# Patient Record
Sex: Female | Born: 1990 | Race: White | Hispanic: No | Marital: Married | Smoking: Never smoker
Health system: Southern US, Community
[De-identification: ages and names within clinical notes are randomized; demographics above are authoritative.]

## PROBLEM LIST (undated history)

## (undated) DIAGNOSIS — J45909 Unspecified asthma, uncomplicated: Secondary | ICD-10-CM

## (undated) DIAGNOSIS — N83209 Unspecified ovarian cyst, unspecified side: Secondary | ICD-10-CM

## (undated) HISTORY — DX: Unspecified ovarian cyst, unspecified side: N83.209

## (undated) HISTORY — DX: Unspecified asthma, uncomplicated: J45.909

## (undated) HISTORY — PX: WISDOM TOOTH EXTRACTION: SHX21

---
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
## 2011-01-05 ENCOUNTER — Ambulatory Visit: Payer: Self-pay

## 2011-01-05 IMAGING — CT CT CHEST W/ CM
1 series · 16 of 32 positions shown, 20 images · IV contrast (APPLIED)
Comparison: none

REASON FOR EXAM: cp
COMMENTS:

PROCEDURE:     CT  - CT CHEST (FOR PE) W  - [DATE]  [DATE]
RESULT:     Chest CT dated [DATE].
TECHNIQUE: Helical 3 mm sections were obtained from the thoracic inlet to
the lung bases status post intravenous administration of 75 mL [N3].

[Series 4: soft tissue · axial · 0.60mm/px · z∈[-270,-21]mm · 16 of 92 slices shown, 20 images]
[im 6/92  soft-tissue]
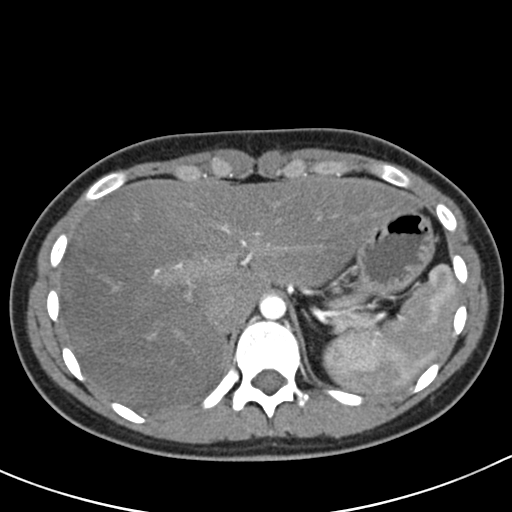
[im 6/92  bone]
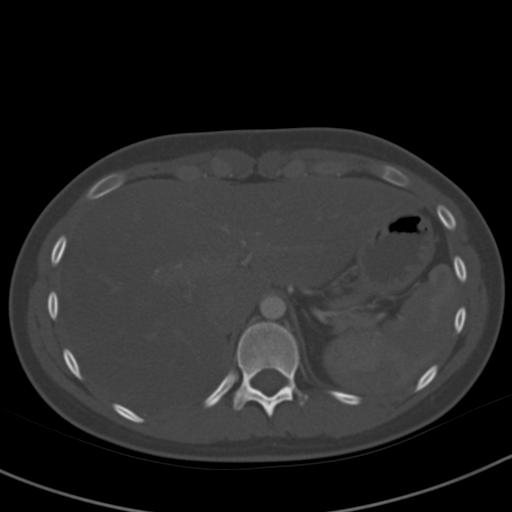
[im 12/92  soft-tissue]
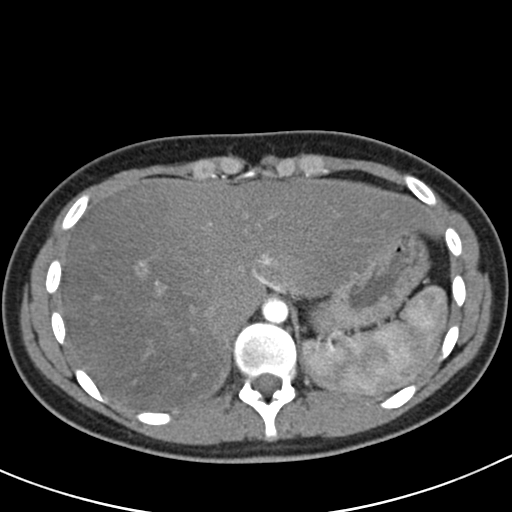
[im 18/92  soft-tissue]
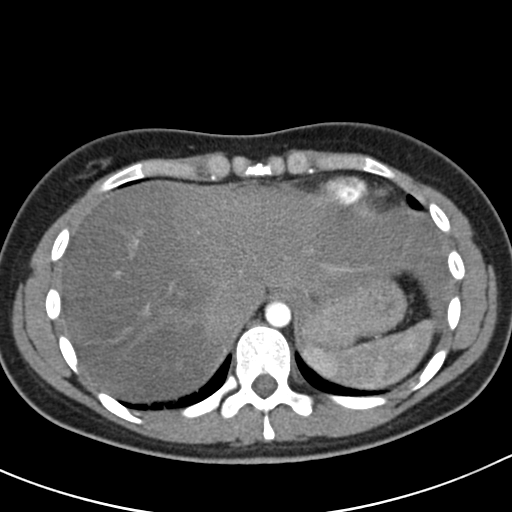
[im 24/92  soft-tissue]
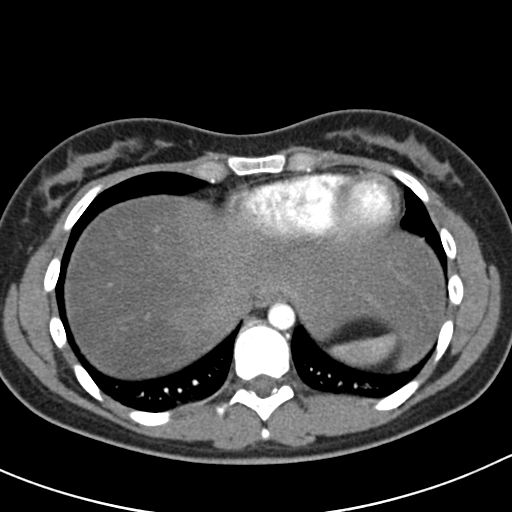
[im 30/92  soft-tissue]
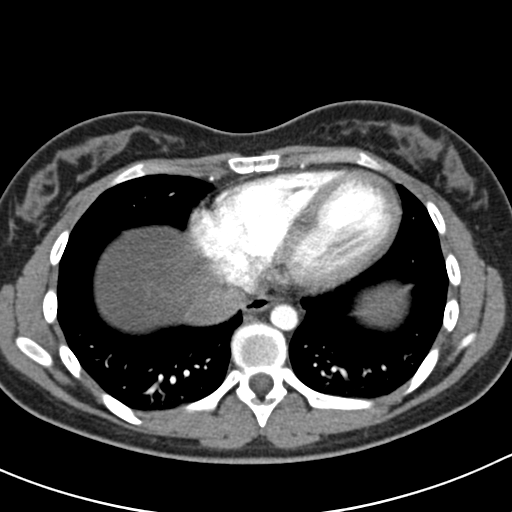
[im 36/92  soft-tissue]
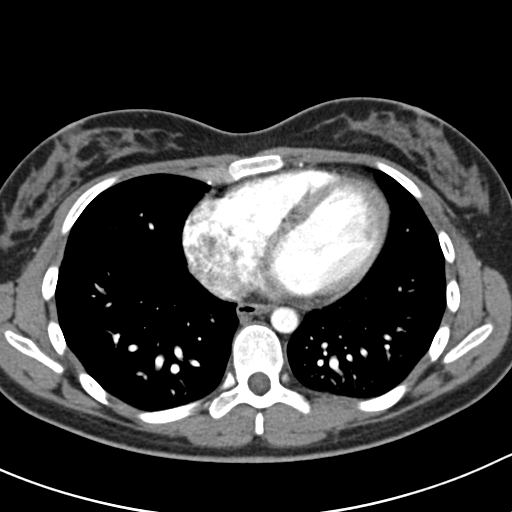
[im 42/92  soft-tissue]
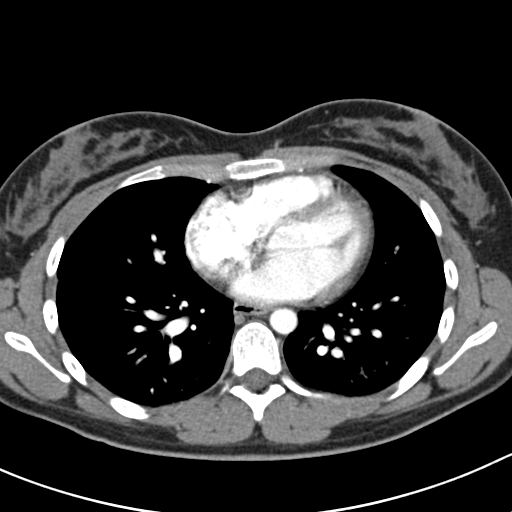
[im 50/92  soft-tissue]
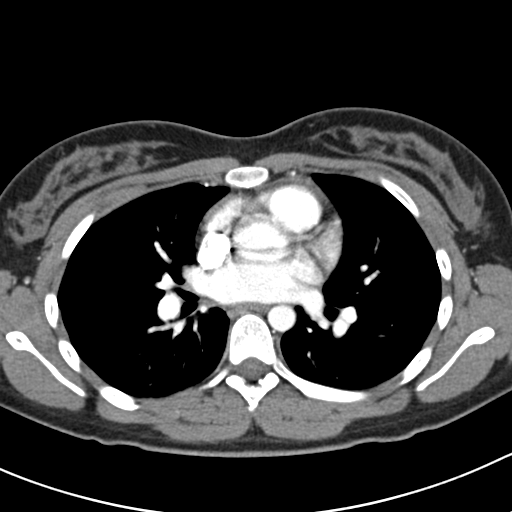
[im 56/92  soft-tissue]
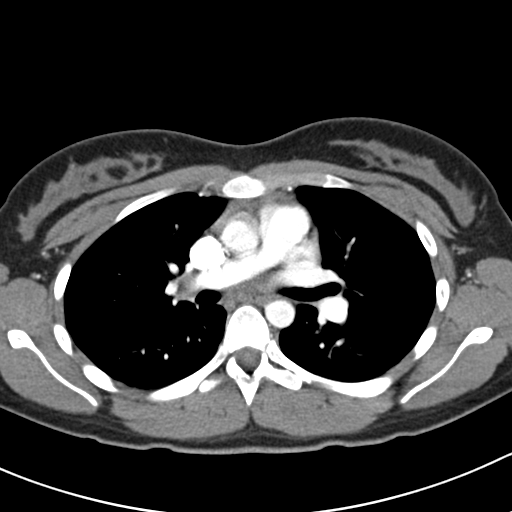
[im 56/92  bone]
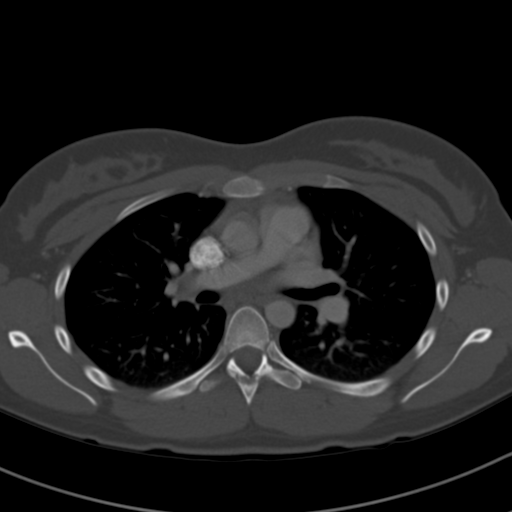
[im 62/92  soft-tissue]
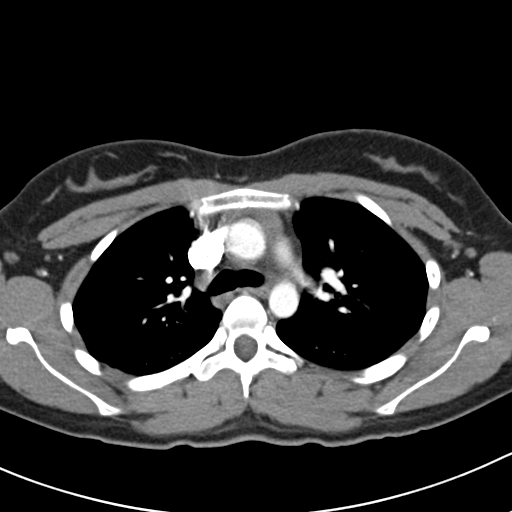
[im 68/92  soft-tissue]
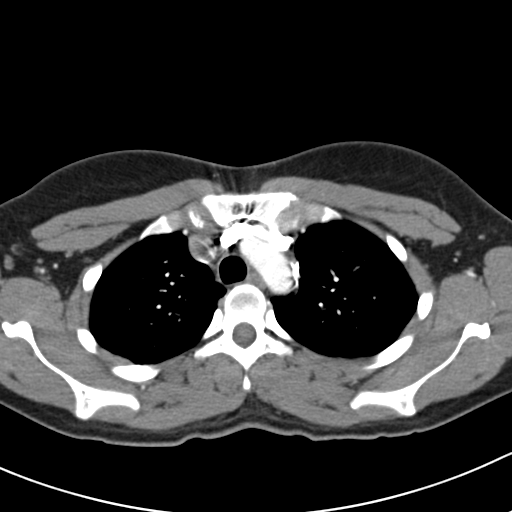
[im 74/92  soft-tissue]
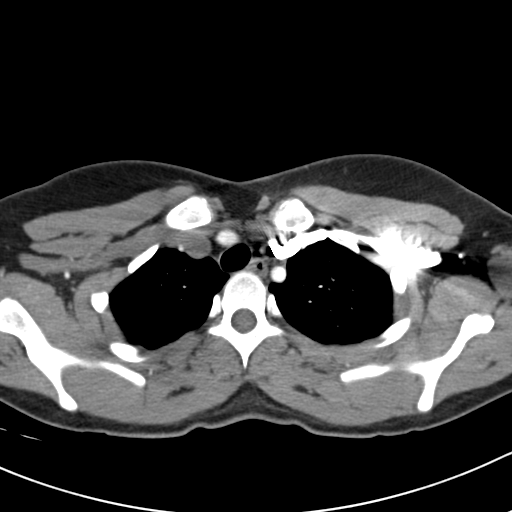
[im 80/92  soft-tissue]
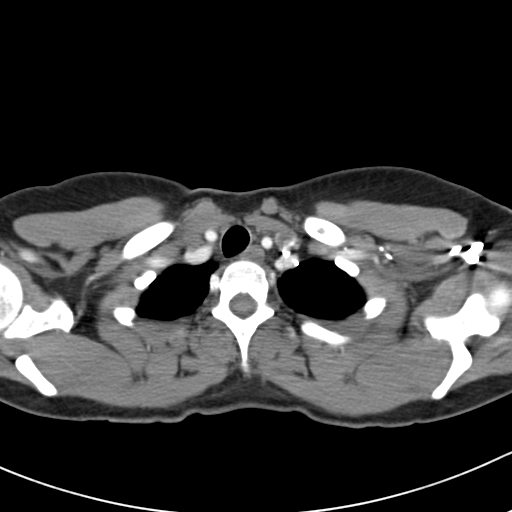
[im 80/92  lung]
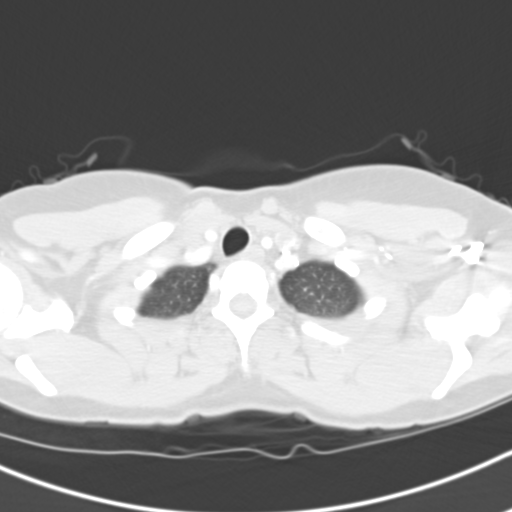
[im 83/92  lung]
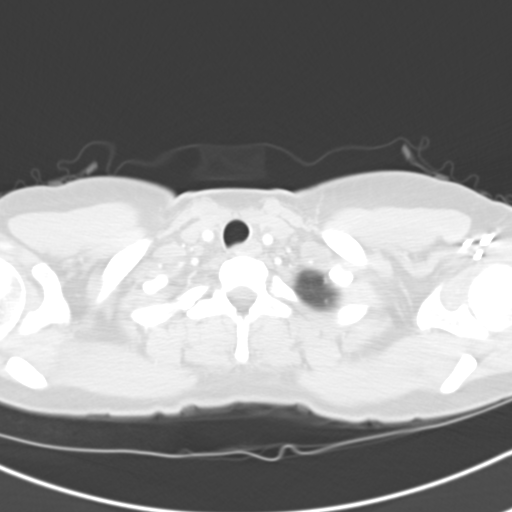
[im 86/92  soft-tissue]
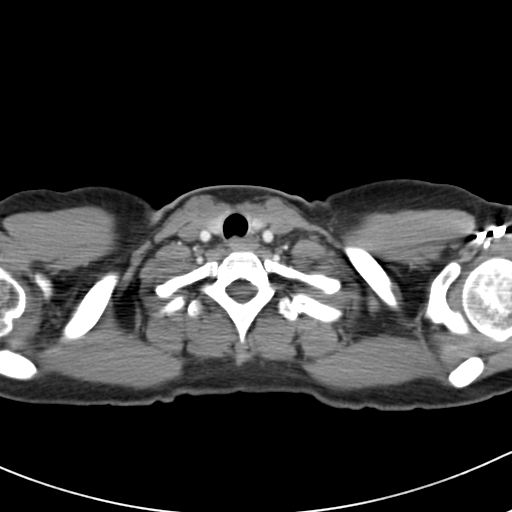
[im 86/92  lung]
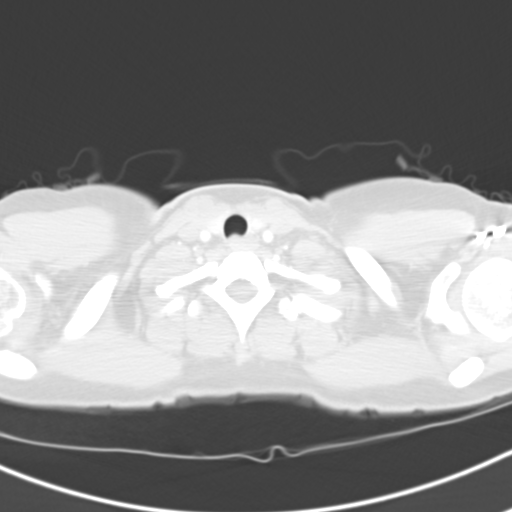
[im 89/92  lung]
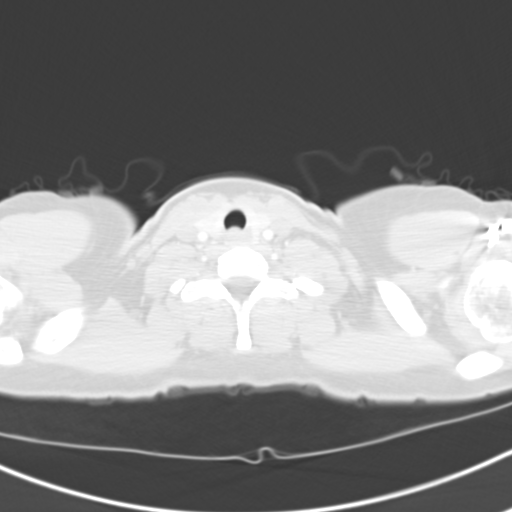

[16 of 32 positions shown; findings below may reference images not displayed]

FINDINGS: Evaluation of the mediastinum and hilar regions and structures
demonstrates no evidence of mediastinal nor hilar adenopathy nor masses.
There is no evidence of filling defects within the main, lobar, or segmental
pulmonary arteries. The lung parenchyma demonstrates mild hypoventilation
within the lung bases may no evidence of focal infiltrates, effusions,
edema, masses nor nodules. A diffuse area of low-attenuation projects along
the right lobe of the liver. Remaining visualized upper abdominal viscera
otherwise unremarkable.
IMPRESSION: No CT evidence of pulmonary arterial embolic disease.
2. Hepatic steatosis.

## 2013-01-08 ENCOUNTER — Emergency Department: Payer: Self-pay | Admitting: Emergency Medicine

## 2013-01-08 LAB — COMPREHENSIVE METABOLIC PANEL
Alkaline Phosphatase: 98 U/L (ref 50–136)
Anion Gap: 5 — ABNORMAL LOW (ref 7–16)
Calcium, Total: 9.2 mg/dL (ref 8.5–10.1)
Chloride: 105 mmol/L (ref 98–107)
Creatinine: 1.09 mg/dL (ref 0.60–1.30)
EGFR (African American): >60
EGFR (Non-African Amer.): >60
Glucose: 99 mg/dL (ref 65–99)
Osmolality: 276 (ref 275–301)
SGPT (ALT): 30 U/L (ref 12–78)
Sodium: 138 mmol/L (ref 136–145)
Total Protein: 7.9 g/dL (ref 6.4–8.2)

## 2013-01-08 LAB — CBC
HCT: 42.6 % (ref 35.0–47.0)
HGB: 14.8 g/dL (ref 12.0–16.0)
MCV: 89 fL (ref 80–100)
RBC: 4.77 10*6/uL (ref 3.80–5.20)
RDW: 11.9 % (ref 11.5–14.5)
WBC: 8.3 10*3/uL (ref 3.6–11.0)

## 2013-01-10 ENCOUNTER — Encounter: Payer: Self-pay | Admitting: Family Medicine

## 2013-01-17 ENCOUNTER — Ambulatory Visit (INDEPENDENT_AMBULATORY_CARE_PROVIDER_SITE_OTHER): Payer: Commercial Indemnity | Admitting: Family Medicine

## 2013-01-17 ENCOUNTER — Encounter: Payer: Self-pay | Admitting: Family Medicine

## 2013-01-17 VITALS — BP 122/82 | HR 92 | Ht 70.0 in | Wt 188.0 lb

## 2013-01-17 DIAGNOSIS — Z01419 Encounter for gynecological examination (general) (routine) without abnormal findings: Secondary | ICD-10-CM

## 2013-01-17 DIAGNOSIS — J45909 Unspecified asthma, uncomplicated: Secondary | ICD-10-CM

## 2013-01-17 DIAGNOSIS — N83209 Unspecified ovarian cyst, unspecified side: Secondary | ICD-10-CM | POA: Insufficient documentation

## 2013-01-17 DIAGNOSIS — Z124 Encounter for screening for malignant neoplasm of cervix: Secondary | ICD-10-CM

## 2013-01-17 HISTORY — DX: Unspecified asthma, uncomplicated: J45.909

## 2013-01-17 MED ORDER — LEVONORGEST-ETH ESTRAD 91-DAY 0.15-0.03 &0.01 MG PO TABS: 1.0000 | ORAL_TABLET | Freq: Every day | ORAL | Status: DC

## 2013-01-17 NOTE — Patient Instructions (Addendum)
Oral Contraception Information Oral contraceptives (OCs) are medicines taken to prevent pregnancy. OCs work by preventing the ovaries from releasing eggs. The hormones in OCs also cause the cervical mucus to thicken, preventing the sperm from entering the uterus. The hormones also cause the uterine lining to become thin, not allowing a fertilized egg to attach to the inside of the uterus. OCs are highly effective when taken exactly as prescribed. However, OCs do not prevent sexually transmitted diseases (STDs). Safe sex practices, such as using condoms along with the pill, can help prevent STDs.  Before taking the pill, you may have a physical exam and Pap test. Your caregiver may order blood tests that may be necessary. Your caregiver will make sure you are a good candidate for oral contraception. Discuss with your caregiver the possible side effects of the OC you may be prescribed. When starting an OC, it can take 2 to 3 months for the body to adjust to the changes in hormone levels in your body.  TYPES OF ORAL CONTRACEPTION  The combination pill. This pill contains estrogen and progestin (synthetic progesterone) hormones. The combination pill comes in either 21-day or 28-day packs. With 21-day packs, you do not take pills for 7 days after the last pill. With 28-day packs, the pill is taken every day. The last 7 pills are without hormones. Certain types of pills have more than 21 hormone-containing pills.  The minipill. This pill contains the progesterone hormone only. It is taken every day continuously. The minipill comes in packs of 91 pills. The first 84 pills contain the hormones, and the last 7 pills do not. The last 7 days are when you will have your menstrual period. You may experience irregular spotting. ADVANTAGES  Decreases premenstrual symptoms.  Treats menstrual period cramps.  Regulates the menstrual cycle.  Decreases a heavy menstrual flow.  Treats acne.  Treats abnormal uterine  bleeding.  Treats chronic pelvic pain.  Treats polycystic ovarian syndrome.  Treats endometriosis.  Can be used as emergency contraception. DISADVANTAGES OCs can be less effective if:  You forget to take the pill at the same time every day.  You have a stomach or intestinal disease that lessens the absorption of the pill.  You take OCs with other medicines that make OCs less effective.  You take expired OCs.  You forget to restart the pill on day 7, when using the packs of 21 pills. Document Released: 07/25/2002 Document Revised: 07/27/2011 Document Reviewed: 09/10/2010 [employer] Patient Information 2014 [employer]. Preventive Care for Adults, Female A healthy lifestyle and preventive care can promote health and wellness. Preventive health guidelines for women include the following key practices.  A routine yearly physical is a good way to check with your caregiver about your health and preventive screening. It is a chance to share any concerns and updates on your health, and to receive a thorough exam.  Visit your dentist for a routine exam and preventive care every 6 months. Brush your teeth twice a day and floss once a day. Good oral hygiene prevents tooth decay and gum disease.  The frequency of eye exams is based on your age, health, family medical history, use of contact lenses, and other factors. Follow your caregiver's recommendations for frequency of eye exams.  Eat a healthy diet. Foods like vegetables, fruits, whole grains, low-fat dairy products, and lean protein foods contain the nutrients you need without too many calories. Decrease your intake of foods high in solid fats, added sugars, and salt.   Eat the right amount of calories for you.Get information about a proper diet from your caregiver, if necessary.  Regular physical exercise is one of the most important things you can do for your health. Most adults should get at least 150 minutes of moderate-intensity  exercise (any activity that increases your heart rate and causes you to sweat) each week. In addition, most adults need muscle-strengthening exercises on 2 or more days a week.  Maintain a healthy weight. The body mass index (BMI) is a screening tool to identify possible weight problems. It provides an estimate of body fat based on height and weight. Your caregiver can help determine your BMI, and can help you achieve or maintain a healthy weight.For adults 20 years and older:  A BMI below 18.5 is considered underweight.  A BMI of 18.5 to 24.9 is normal.  A BMI of 25 to 29.9 is considered overweight.  A BMI of 30 and above is considered obese.  Maintain normal blood lipids and cholesterol levels by exercising and minimizing your intake of saturated fat. Eat a balanced diet with plenty of fruit and vegetables. Blood tests for lipids and cholesterol should begin at age 20 and be repeated every 5 years. If your lipid or cholesterol levels are high, you are over 50, or you are at high risk for heart disease, you may need your cholesterol levels checked more frequently.Ongoing high lipid and cholesterol levels should be treated with medicines if diet and exercise are not effective.  If you smoke, find out from your caregiver how to quit. If you do not use tobacco, do not start.  If you are pregnant, do not drink alcohol. If you are breastfeeding, be very cautious about drinking alcohol. If you are not pregnant and choose to drink alcohol, do not exceed 1 drink per day. One drink is considered to be 12 ounces (355 mL) of beer, 5 ounces (148 mL) of wine, or 1.5 ounces (44 mL) of liquor.  Avoid use of street drugs. Do not share needles with anyone. Ask for help if you need support or instructions about stopping the use of drugs.  High blood pressure causes heart disease and increases the risk of stroke. Your blood pressure should be checked at least every 1 to 2 years. Ongoing high blood pressure  should be treated with medicines if weight loss and exercise are not effective.  If you are 55 to 22 years old, ask your caregiver if you should take aspirin to prevent strokes.  Diabetes screening involves taking a blood sample to check your fasting blood sugar level. This should be done once every 3 years, after age 45, if you are within normal weight and without risk factors for diabetes. Testing should be considered at a younger age or be carried out more frequently if you are overweight and have at least 1 risk factor for diabetes.  Breast cancer screening is essential preventive care for women. You should practice "breast self-awareness." This means understanding the normal appearance and feel of your breasts and may include breast self-examination. Any changes detected, no matter how small, should be reported to a caregiver. Women in their 20s and 30s should have a clinical breast exam (CBE) by a caregiver as part of a regular health exam every 1 to 3 years. After age 40, women should have a CBE every year. Starting at age 40, women should consider having a mammography (breast X-ray test) every year. Women who have a family history of breast cancer   should talk to their caregiver about genetic screening. Women at a high risk of breast cancer should talk to their caregivers about having magnetic resonance imaging (MRI) and a mammography every year.  The Pap test is a screening test for cervical cancer. A Pap test can show cell changes on the cervix that might become cervical cancer if left untreated. A Pap test is a procedure in which cells are obtained and examined from the lower end of the uterus (cervix).  Women should have a Pap test starting at age 21.  Between ages 21 and 29, Pap tests should be repeated every 2 years.  Beginning at age 30, you should have a Pap test every 3 years as long as the past 3 Pap tests have been normal.  Some women have medical problems that increase the chance  of getting cervical cancer. Talk to your caregiver about these problems. It is especially important to talk to your caregiver if a new problem develops soon after your last Pap test. In these cases, your caregiver may recommend more frequent screening and Pap tests.  The above recommendations are the same for women who have or have not gotten the vaccine for human papillomavirus (HPV).  If you had a hysterectomy for a problem that was not cancer or a condition that could lead to cancer, then you no longer need Pap tests. Even if you no longer need a Pap test, a regular exam is a good idea to make sure no other problems are starting.  If you are between ages 65 and 70, and you have had normal Pap tests going back 10 years, you no longer need Pap tests. Even if you no longer need a Pap test, a regular exam is a good idea to make sure no other problems are starting.  If you have had past treatment for cervical cancer or a condition that could lead to cancer, you need Pap tests and screening for cancer for at least 20 years after your treatment.  If Pap tests have been discontinued, risk factors (such as a new sexual partner) need to be reassessed to determine if screening should be resumed.  The HPV test is an additional test that may be used for cervical cancer screening. The HPV test looks for the virus that can cause the cell changes on the cervix. The cells collected during the Pap test can be tested for HPV. The HPV test could be used to screen women aged 30 years and older, and should be used in women of any age who have unclear Pap test results. After the age of 30, women should have HPV testing at the same frequency as a Pap test.  Colorectal cancer can be detected and often prevented. Most routine colorectal cancer screening begins at the age of 50 and continues through age 75. However, your caregiver may recommend screening at an earlier age if you have risk factors for colon cancer. On a yearly  basis, your caregiver may provide home test kits to check for hidden blood in the stool. Use of a small camera at the end of a tube, to directly examine the colon (sigmoidoscopy or colonoscopy), can detect the earliest forms of colorectal cancer. Talk to your caregiver about this at age 50, when routine screening begins. Direct examination of the colon should be repeated every 5 to 10 years through age 75, unless early forms of pre-cancerous polyps or small growths are found.  Hepatitis C blood testing is recommended for all   people born from 1945 through 1965 and any individual with known risks for hepatitis C.  Practice safe sex. Use condoms and avoid high-risk sexual practices to reduce the spread of sexually transmitted infections (STIs). STIs include gonorrhea, chlamydia, syphilis, trichomonas, herpes, HPV, and human immunodeficiency virus (HIV). Herpes, HIV, and HPV are viral illnesses that have no cure. They can result in disability, cancer, and death. Sexually active women aged 25 and younger should be checked for chlamydia. Older women with new or multiple partners should also be tested for chlamydia. Testing for other STIs is recommended if you are sexually active and at increased risk.  Osteoporosis is a disease in which the bones lose minerals and strength with aging. This can result in serious bone fractures. The risk of osteoporosis can be identified using a bone density scan. Women ages 65 and over and women at risk for fractures or osteoporosis should discuss screening with their caregivers. Ask your caregiver whether you should take a calcium supplement or vitamin D to reduce the rate of osteoporosis.  Menopause can be associated with physical symptoms and risks. Hormone replacement therapy is available to decrease symptoms and risks. You should talk to your caregiver about whether hormone replacement therapy is right for you.  Use sunscreen with sun protection factor (SPF) of 30 or more.  Apply sunscreen liberally and repeatedly throughout the day. You should seek shade when your shadow is shorter than you. Protect yourself by wearing long sleeves, pants, a wide-brimmed hat, and sunglasses year round, whenever you are outdoors.  Once a month, do a whole body skin exam, using a mirror to look at the skin on your back. Notify your caregiver of new moles, moles that have irregular borders, moles that are larger than a pencil eraser, or moles that have changed in shape or color.  Stay current with required immunizations.  Influenza. You need a dose every fall (or winter). The composition of the flu vaccine changes each year, so being vaccinated once is not enough.  Pneumococcal polysaccharide. You need 1 to 2 doses if you smoke cigarettes or if you have certain chronic medical conditions. You need 1 dose at age 65 (or older) if you have never been vaccinated.  Tetanus, diphtheria, pertussis (Tdap, Td). Get 1 dose of Tdap vaccine if you are younger than age 65, are over 65 and have contact with an infant, are a healthcare worker, are pregnant, or simply want to be protected from whooping cough. After that, you need a Td booster dose every 10 years. Consult your caregiver if you have not had at least 3 tetanus and diphtheria-containing shots sometime in your life or have a deep or dirty wound.  HPV. You need this vaccine if you are a woman age 26 or younger. The vaccine is given in 3 doses over 6 months.  Measles, mumps, rubella (MMR). You need at least 1 dose of MMR if you were born in 1957 or later. You may also need a second dose.  Meningococcal. If you are age 19 to 21 and a first-year college student living in a residence hall, or have one of several medical conditions, you need to get vaccinated against meningococcal disease. You may also need additional booster doses.  Zoster (shingles). If you are age 60 or older, you should get this vaccine.  Varicella (chickenpox). If you have  never had chickenpox or you were vaccinated but received only 1 dose, talk to your caregiver to find out if you need this   vaccine.  Hepatitis A. You need this vaccine if you have a specific risk factor for hepatitis A virus infection or you simply wish to be protected from this disease. The vaccine is usually given as 2 doses, 6 to 18 months apart.  Hepatitis B. You need this vaccine if you have a specific risk factor for hepatitis B virus infection or you simply wish to be protected from this disease. The vaccine is given in 3 doses, usually over 6 months. Preventive Services / Frequency Ages 19 to 39  Blood pressure check.** / Every 1 to 2 years.  Lipid and cholesterol check.** / Every 5 years beginning at age 20.  Clinical breast exam.** / Every 3 years for women in their 20s and 30s.  Pap test.** / Every 2 years from ages 21 through 29. Every 3 years starting at age 30 through age 65 or 70 with a history of 3 consecutive normal Pap tests.  HPV screening.** / Every 3 years from ages 30 through ages 65 to 70 with a history of 3 consecutive normal Pap tests.  Hepatitis C blood test.** / For any individual with known risks for hepatitis C.  Skin self-exam. / Monthly.  Influenza immunization.** / Every year.  Pneumococcal polysaccharide immunization.** / 1 to 2 doses if you smoke cigarettes or if you have certain chronic medical conditions.  Tetanus, diphtheria, pertussis (Tdap, Td) immunization. / A one-time dose of Tdap vaccine. After that, you need a Td booster dose every 10 years.  HPV immunization. / 3 doses over 6 months, if you are 26 and younger.  Measles, mumps, rubella (MMR) immunization. / You need at least 1 dose of MMR if you were born in 1957 or later. You may also need a second dose.  Meningococcal immunization. / 1 dose if you are age 19 to 21 and a first-year college student living in a residence hall, or have one of several medical conditions, you need to get  vaccinated against meningococcal disease. You may also need additional booster doses.  Varicella immunization.** / Consult your caregiver.  Hepatitis A immunization.** / Consult your caregiver. 2 doses, 6 to 18 months apart.  Hepatitis B immunization.** / Consult your caregiver. 3 doses usually over 6 months. Ages 40 to 64  Blood pressure check.** / Every 1 to 2 years.  Lipid and cholesterol check.** / Every 5 years beginning at age 20.  Clinical breast exam.** / Every year after age 40.  Mammogram.** / Every year beginning at age 40 and continuing for as long as you are in good health. Consult with your caregiver.  Pap test.** / Every 3 years starting at age 30 through age 65 or 70 with a history of 3 consecutive normal Pap tests.  HPV screening.** / Every 3 years from ages 30 through ages 65 to 70 with a history of 3 consecutive normal Pap tests.  Fecal occult blood test (FOBT) of stool. / Every year beginning at age 50 and continuing until age 75. You may not need to do this test if you get a colonoscopy every 10 years.  Flexible sigmoidoscopy or colonoscopy.** / Every 5 years for a flexible sigmoidoscopy or every 10 years for a colonoscopy beginning at age 50 and continuing until age 75.  Hepatitis C blood test.** / For all people born from 1945 through 1965 and any individual with known risks for hepatitis C.  Skin self-exam. / Monthly.  Influenza immunization.** / Every year.  Pneumococcal polysaccharide immunization.** / 1   to 2 doses if you smoke cigarettes or if you have certain chronic medical conditions.  Tetanus, diphtheria, pertussis (Tdap, Td) immunization.** / A one-time dose of Tdap vaccine. After that, you need a Td booster dose every 10 years.  Measles, mumps, rubella (MMR) immunization. / You need at least 1 dose of MMR if you were born in 1957 or later. You may also need a second dose.  Varicella immunization.** / Consult your caregiver.  Meningococcal  immunization.** / Consult your caregiver.  Hepatitis A immunization.** / Consult your caregiver. 2 doses, 6 to 18 months apart.  Hepatitis B immunization.** / Consult your caregiver. 3 doses, usually over 6 months. Ages 65 and over  Blood pressure check.** / Every 1 to 2 years.  Lipid and cholesterol check.** / Every 5 years beginning at age 20.  Clinical breast exam.** / Every year after age 40.  Mammogram.** / Every year beginning at age 40 and continuing for as long as you are in good health. Consult with your caregiver.  Pap test.** / Every 3 years starting at age 30 through age 65 or 70 with a 3 consecutive normal Pap tests. Testing can be stopped between 65 and 70 with 3 consecutive normal Pap tests and no abnormal Pap or HPV tests in the past 10 years.  HPV screening.** / Every 3 years from ages 30 through ages 65 or 70 with a history of 3 consecutive normal Pap tests. Testing can be stopped between 65 and 70 with 3 consecutive normal Pap tests and no abnormal Pap or HPV tests in the past 10 years.  Fecal occult blood test (FOBT) of stool. / Every year beginning at age 50 and continuing until age 75. You may not need to do this test if you get a colonoscopy every 10 years.  Flexible sigmoidoscopy or colonoscopy.** / Every 5 years for a flexible sigmoidoscopy or every 10 years for a colonoscopy beginning at age 50 and continuing until age 75.  Hepatitis C blood test.** / For all people born from 1945 through 1965 and any individual with known risks for hepatitis C.  Osteoporosis screening.** / A one-time screening for women ages 65 and over and women at risk for fractures or osteoporosis.  Skin self-exam. / Monthly.  Influenza immunization.** / Every year.  Pneumococcal polysaccharide immunization.** / 1 dose at age 65 (or older) if you have never been vaccinated.  Tetanus, diphtheria, pertussis (Tdap, Td) immunization. / A one-time dose of Tdap vaccine if you are over 65 and  have contact with an infant, are a healthcare worker, or simply want to be protected from whooping cough. After that, you need a Td booster dose every 10 years.  Varicella immunization.** / Consult your caregiver.  Meningococcal immunization.** / Consult your caregiver.  Hepatitis A immunization.** / Consult your caregiver. 2 doses, 6 to 18 months apart.  Hepatitis B immunization.** / Check with your caregiver. 3 doses, usually over 6 months. ** Family history and personal history of risk and conditions may change your caregiver's recommendations. Document Released: 06/30/2001 Document Revised: 07/27/2011 Document Reviewed: 09/29/2010 [employer] Patient Information 2014 [employer].  

## 2013-01-17 NOTE — Progress Notes (Signed)
  Subjective:     Erin Whitney is a 22 y.o. female and is here for a comprehensive physical exam. The patient reports no problems. Seen in ED with Sciatica.  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  Now she is working first job as a Runner, broadcasting/film/video. Referred by Carolyne Fiscal. Not sexually active.  Had been on Seasonique previously for management of ovarian cysts.  Has not had Gardasil series.  History   Social History  . Marital Status: Single    Spouse Name: N/A    Number of Children: N/A  . Years of Education: N/A   Occupational History  . 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 Smoking status: Never Smoker   . Smokeless tobacco: Never Used  . Alcohol Use: Yes     Comment: social  . Drug Use: No  . Sexual Activity: No   Other Topics Concern  . Not on file   Social History Narrative  . No narrative on file   No health maintenance topics applied.  The following portions of the patient's history were reviewed and updated as appropriate: allergies, current medications, past family history, past medical history, past social history, past surgical history and problem list.  Review of Systems A comprehensive review of systems was negative.   Objective:    BP 122/82  Pulse 92  Ht 5\' 10"  (1.778 m)  Wt 188 lb (85.276 kg)  BMI 26.98 kg/m2  LMP 12/20/2012 General appearance: alert, cooperative and appears stated age Head: Normocephalic, without obvious abnormality, atraumatic Neck: no adenopathy, supple, symmetrical, trachea midline and thyroid not enlarged, symmetric, no tenderness/mass/nodules Lungs: clear to auscultation bilaterally Breasts: normal appearance, no masses or tenderness Heart: regular rate and rhythm, S1, S2 normal, no murmur, click, rub or gallop Abdomen: soft, non-tender; bowel sounds normal; no masses,  no organomegaly Pelvic: cervix normal in appearance, external genitalia normal, no adnexal masses or tenderness, no cervical motion tenderness, uterus  normal size, shape, and consistency and vagina normal without discharge Extremities: extremities normal, atraumatic, no cyanosis or edema Pulses: 2+ and symmetric Skin: Skin color, texture, turgor normal. No rashes or lesions Lymph nodes: Cervical, supraclavicular, and axillary nodes normal. Neurologic: Grossly normal    Assessment:    Healthy female exam.      Plan:    Pap smear today-- Recommend Gardasil series-she will discuss with her mom. Seasonique No need for STD testing. See After Visit Summary for Counseling Recommendations

## 2013-08-07 ENCOUNTER — Telehealth: Payer: Self-pay

## 2013-08-07 NOTE — Telephone Encounter (Signed)
Mother called regarding her daughters bill. Our billing department has billed her and is now sending her to collections. Originally it was covered 100% then they received another bill stating they had an outstanding balance. This was very confusing since her insurance covered her visit 100% they called to verify. Patient came in for a annual and it should have been covered. After a second call I called Marni to verify and she said there was an issue and that I need to contact our billing department about it. I called spoke to Darl Householder she check and said that there was a problem there was a refund of 202.65 to the patients insurance done by our outside billing company and that they didn't know why. She said they had to look into it and figure out why. At the time she couldn't give me any reason why. She said it would be put under review and assured me patient was not responsible at the time for the outstanding balance until they knew exactly what happened. I told them patient was being sent to collections and they were worried about patients credit. She said once they sent it over to another billing division to review she would no longer get sent to collections or be responsible for the bill at this time until further notice. Once it was fixed patient would receive an updated bill if she even had a balance. If not, she wouldn't get anything at all.

## 2014-06-26 ENCOUNTER — Ambulatory Visit: Payer: BC Managed Care – PPO | Admitting: Family Medicine

## 2014-07-11 ENCOUNTER — Ambulatory Visit (INDEPENDENT_AMBULATORY_CARE_PROVIDER_SITE_OTHER): Payer: BC Managed Care – PPO | Admitting: Physician Assistant

## 2014-07-11 ENCOUNTER — Encounter: Payer: Self-pay | Admitting: Physician Assistant

## 2014-07-11 VITALS — BP 128/79 | Ht 70.0 in | Wt 181.0 lb

## 2014-07-11 DIAGNOSIS — Z01419 Encounter for gynecological examination (general) (routine) without abnormal findings: Secondary | ICD-10-CM

## 2014-07-11 DIAGNOSIS — Z124 Encounter for screening for malignant neoplasm of cervix: Secondary | ICD-10-CM

## 2014-07-11 DIAGNOSIS — Z113 Encounter for screening for infections with a predominantly sexual mode of transmission: Secondary | ICD-10-CM | POA: Diagnosis not present

## 2014-07-11 DIAGNOSIS — Z1151 Encounter for screening for human papillomavirus (HPV): Secondary | ICD-10-CM | POA: Diagnosis not present

## 2014-07-11 DIAGNOSIS — Z3041 Encounter for surveillance of contraceptive pills: Secondary | ICD-10-CM | POA: Diagnosis not present

## 2014-07-11 MED ORDER — LEVONORGEST-ETH ESTRAD 91-DAY 0.15-0.03 &0.01 MG PO TABS: 1.0000 | ORAL_TABLET | Freq: Every day | ORAL | Status: DC

## 2014-07-11 NOTE — Patient Instructions (Signed)

## 2014-07-16 LAB — CYTOLOGY - PAP

## 2014-07-17 ENCOUNTER — Encounter: Payer: Self-pay | Admitting: Physician Assistant

## 2014-07-17 NOTE — Progress Notes (Signed)
Patient ID: CASSEY BACIGALUPO, female   DOB: February 09, 1991, 24 y.o.   MRN: [id number] History:  CHARLETT MERKLE is a 24 y.o. G0P0000 who presents to clinic today for annual exam.  She has no complaints.  Generic Seasonique worked well for her this past year.  She has now run out and is not using any hormonal therapies.  She is not presently sexually active.  She desires refill of OCP.  The following portions of the patient's history were reviewed and updated as appropriate: allergies, current medications, past family history, past medical history, past social history, past surgical history and problem list.  Review of Systems:  A comprehensive review of systems was negative.  Objective:  Physical Exam BP 128/79 mmHg  Ht 5\' 10"  (1.778 m)  Wt 181 lb (82.101 kg)  BMI 25.97 kg/m2  LMP 06/23/2014 GENERAL: Well-developed, well-nourished female in no acute distress.  HEENT: Normocephalic, atraumatic.  NECK: Supple. Normal thyroid.  LUNGS: Normal rate. Clear to auscultation bilaterally.  HEART: Regular rate and rhythm with no adventitious sounds.  BREASTS: Symmetric in size. No masses, skin changes, nipple drainage, or lymphadenopathy. ABDOMEN: Soft, nontender, nondistended. No organomegaly. Normal bowel sounds appreciated in all quadrants.  PELVIC: Normal external female genitalia. Vagina is pink and rugated.  Normal discharge. Normal cervix contour. Pap smear obtained. Uterus is normal in size. No adnexal mass or tenderness.  EXTREMITIES: No cyanosis, clubbing, or edema, 2+ distal pulses.   Labs and Imaging No results found.  Assessment & Plan:  Assessment: Healthy female desiring OCP  Plans: Seasonique x 1 year RTC PRN, 1 year for annual exam  Paticia Stack, PA-C 07/17/2014 8:14 AM

## 2017-07-13 ENCOUNTER — Encounter: Payer: Self-pay | Admitting: Family Medicine

## 2017-07-13 ENCOUNTER — Ambulatory Visit (INDEPENDENT_AMBULATORY_CARE_PROVIDER_SITE_OTHER): Payer: BC Managed Care – PPO | Admitting: Family Medicine

## 2017-07-13 VITALS — BP 132/82 | Wt 189.4 lb

## 2017-07-13 DIAGNOSIS — Z30011 Encounter for initial prescription of contraceptive pills: Secondary | ICD-10-CM

## 2017-07-13 DIAGNOSIS — Z124 Encounter for screening for malignant neoplasm of cervix: Secondary | ICD-10-CM

## 2017-07-13 DIAGNOSIS — Z01419 Encounter for gynecological examination (general) (routine) without abnormal findings: Secondary | ICD-10-CM

## 2017-07-13 MED ORDER — NORGESTIMATE-ETH ESTRADIOL 0.25-35 MG-MCG PO TABS: 1.0000 | ORAL_TABLET | Freq: Every day | ORAL | 4 refills | Status: DC

## 2017-07-13 NOTE — Patient Instructions (Signed)
Preventive Care 18-39 Years, Female Preventive care refers to lifestyle choices and visits with your health care provider that can promote health and wellness. What does preventive care include?  A yearly physical exam. This is also called an annual well check.  Dental exams once or twice a year.  Routine eye exams. Ask your health care provider how often you should have your eyes checked.  Personal lifestyle choices, including: ? Daily care of your teeth and gums. ? Regular physical activity. ? Eating a healthy diet. ? Avoiding tobacco and drug use. ? Limiting alcohol use. ? Practicing safe sex. ? Taking vitamin and mineral supplements as recommended by your health care provider. What happens during an annual well check? The services and screenings done by your health care provider during your annual well check will depend on your age, overall health, lifestyle risk factors, and family history of disease. Counseling Your health care provider may ask you questions about your:  Alcohol use.  Tobacco use.  Drug use.  Emotional well-being.  Home and relationship well-being.  Sexual activity.  Eating habits.  Work and work Statistician.  Method of birth control.  Menstrual cycle.  Pregnancy history.  Screening You may have the following tests or measurements:  Height, weight, and BMI.  Diabetes screening. This is done by checking your blood sugar (glucose) after you have not eaten for a while (fasting).  Blood pressure.  Lipid and cholesterol levels. These may be checked every 5 years starting at age 38.  Skin check.  Hepatitis C blood test.  Hepatitis B blood test.  Sexually transmitted disease (STD) testing.  BRCA-related cancer screening. This may be done if you have a family history of breast, ovarian, tubal, or peritoneal cancers.  Pelvic exam and Pap test. This may be done every 3 years starting at age 38. Starting at age 30, this may be done  every 5 years if you have a Pap test in combination with an HPV test.  Discuss your test results, treatment options, and if necessary, the need for more tests with your health care provider. Vaccines Your health care provider may recommend certain vaccines, such as:  Influenza vaccine. This is recommended every year.  Tetanus, diphtheria, and acellular pertussis (Tdap, Td) vaccine. You may need a Td booster every 10 years.  Varicella vaccine. You may need this if you have not been vaccinated.  HPV vaccine. If you are 39 or younger, you may need three doses over 6 months.  Measles, mumps, and rubella (MMR) vaccine. You may need at least one dose of MMR. You may also need a second dose.  Pneumococcal 13-valent conjugate (PCV13) vaccine. You may need this if you have certain conditions and were not previously vaccinated.  Pneumococcal polysaccharide (PPSV23) vaccine. You may need one or two doses if you smoke cigarettes or if you have certain conditions.  Meningococcal vaccine. One dose is recommended if you are age 68-21 years and a first-year college student living in a residence hall, or if you have one of several medical conditions. You may also need additional booster doses.  Hepatitis A vaccine. You may need this if you have certain conditions or if you travel or work in places where you may be exposed to hepatitis A.  Hepatitis B vaccine. You may need this if you have certain conditions or if you travel or work in places where you may be exposed to hepatitis B.  Haemophilus influenzae type b (Hib) vaccine. You may need this  if you have certain risk factors.  Talk to your health care provider about which screenings and vaccines you need and how often you need them. This information is not intended to replace advice given to you by your health care provider. Make sure you discuss any questions you have with your health care provider. Document Released: 06/30/2001 Document Revised:  01/22/2016 Document Reviewed: 03/05/2015 Elsevier Interactive Patient Education  2018 Elsevier Inc.  

## 2017-07-13 NOTE — Progress Notes (Signed)
  Subjective:     Erin Whitney is a 27 y.o. female and is here for a comprehensive physical exam. The patient reports no problems. 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 Getting married in June. Will wait one year prior to trying to conceive. Has never been sexually active.  Social History   Socioeconomic History  . Marital status: Single    Spouse name: Not on file  . Number of children: Not on file  . Years of education: 4  . Highest education level: Not on file  Social Needs  . Financial resource strain: Not on file  . Food insecurity - worry: Not on file  . Food insecurity - inability: Not on file  . Transportation needs - medical: Not on file  . Transportation needs - non-medical: Not on file  Occupational History  . Occupation: Pharmacist, hospital    Comment: Kindergarten-[employer]  Tobacco Use  . Smoking status: Never Smoker  . Smokeless tobacco: Never Used  Substance and Sexual Activity  . Alcohol use: Yes    Comment: social  . Drug use: No  . Sexual activity: No    Birth control/protection: None, Abstinence  Other Topics Concern  . Not on file  Social History Narrative  . Not on file   Health Maintenance  Topic Date Due  . HIV Screening  01/23/2006  . TETANUS/TDAP  01/23/2010  . INFLUENZA VACCINE  12/16/2016  . PAP SMEAR  07/11/2017    The following portions of the patient's history were reviewed and updated as appropriate: allergies, current medications, past family history, past medical history, past social history, past surgical history and problem list.  Review of Systems Pertinent items noted in HPI and remainder of comprehensive ROS otherwise negative.   Objective:    BP 132/82   Wt 189 lb 6.4 oz (85.9 kg)   LMP  (Approximate)   BMI 27.18 kg/m  General appearance: alert, cooperative and appears stated age Head: Normocephalic, without obvious abnormality, atraumatic Neck: no adenopathy, supple, symmetrical, trachea  midline and thyroid not enlarged, symmetric, no tenderness/mass/nodules Lungs: clear to auscultation bilaterally Breasts: normal appearance, no masses or tenderness Heart: regular rate and rhythm, S1, S2 normal, no murmur, click, rub or gallop Abdomen: soft, non-tender; bowel sounds normal; no masses,  no organomegaly Pelvic: cervix normal in appearance, external genitalia normal, no adnexal masses or tenderness, no cervical motion tenderness, uterus normal size, shape, and consistency and vagina normal without discharge Extremities: extremities normal, atraumatic, no cyanosis or edema Pulses: 2+ and symmetric Skin: Skin color, texture, turgor normal. No rashes or lesions Lymph nodes: Cervical, supraclavicular, and axillary nodes normal. Neurologic: Grossly normal    Assessment:    Healthy female exam.      Plan:      Problem List Items Addressed This Visit    None    Visit Diagnoses    Encounter for initial prescription of contraceptive pills    -  Primary   Relevant Medications   norgestimate-ethinyl estradiol (ORTHO-CYCLEN,SPRINTEC,PREVIFEM) 0.25-35 MG-MCG tablet   Screening for cervical cancer       Relevant Orders   Cytology - PAP   Encounter for gynecological examination without abnormal finding         Return in 1 year (on 07/13/2018).   See After Visit Summary for Counseling Recommendations

## 2017-07-13 NOTE — Progress Notes (Signed)
LAST PAP 06/2014 Discuss Birth Control Declined flu

## 2017-07-15 LAB — CYTOLOGY - PAP: DIAGNOSIS: NEGATIVE

## 2018-05-02 ENCOUNTER — Encounter: Payer: Self-pay | Admitting: Radiology

## 2018-08-19 ENCOUNTER — Other Ambulatory Visit: Payer: Self-pay | Admitting: *Deleted

## 2018-08-19 DIAGNOSIS — Z30011 Encounter for initial prescription of contraceptive pills: Secondary | ICD-10-CM

## 2018-08-19 MED ORDER — NORGESTIMATE-ETH ESTRADIOL 0.25-35 MG-MCG PO TABS: 1.0000 | ORAL_TABLET | Freq: Every day | ORAL | 2 refills | Status: DC

## 2018-10-26 ENCOUNTER — Telehealth: Payer: Self-pay | Admitting: Radiology

## 2018-10-26 NOTE — Telephone Encounter (Signed)
Spoke with patient to schedule Annual Exam (last 07/13/17) with Dr Kennon Rounds, Patient states that she is about to start traveling with the family and will back to schedule appointment.

## 2019-03-07 ENCOUNTER — Encounter: Payer: Self-pay | Admitting: Family Medicine

## 2019-03-07 ENCOUNTER — Other Ambulatory Visit: Payer: Self-pay

## 2019-03-07 ENCOUNTER — Ambulatory Visit (INDEPENDENT_AMBULATORY_CARE_PROVIDER_SITE_OTHER): Payer: BC Managed Care – PPO | Admitting: Family Medicine

## 2019-03-07 VITALS — BP 139/86 | HR 147 | Wt 205.0 lb

## 2019-03-07 DIAGNOSIS — Z113 Encounter for screening for infections with a predominantly sexual mode of transmission: Secondary | ICD-10-CM

## 2019-03-07 DIAGNOSIS — Z3490 Encounter for supervision of normal pregnancy, unspecified, unspecified trimester: Secondary | ICD-10-CM | POA: Insufficient documentation

## 2019-03-07 DIAGNOSIS — O0990 Supervision of high risk pregnancy, unspecified, unspecified trimester: Secondary | ICD-10-CM | POA: Insufficient documentation

## 2019-03-07 DIAGNOSIS — Z3A10 10 weeks gestation of pregnancy: Secondary | ICD-10-CM | POA: Diagnosis not present

## 2019-03-07 DIAGNOSIS — Z3491 Encounter for supervision of normal pregnancy, unspecified, first trimester: Secondary | ICD-10-CM | POA: Diagnosis not present

## 2019-03-07 DIAGNOSIS — J4520 Mild intermittent asthma, uncomplicated: Secondary | ICD-10-CM

## 2019-03-07 DIAGNOSIS — Z23 Encounter for immunization: Secondary | ICD-10-CM

## 2019-03-07 LAB — OB RESULTS CONSOLE GBS: GBS: POSITIVE

## 2019-03-07 MED ORDER — ALBUTEROL SULFATE HFA 108 (90 BASE) MCG/ACT IN AERS: 2.0000 | INHALATION_SPRAY | Freq: Four times a day (QID) | RESPIRATORY_TRACT | 1 refills | Status: AC | PRN

## 2019-03-07 NOTE — Progress Notes (Signed)
Subjective:   Erin Whitney is a 28 y.o. G1P0000 at [redacted]w[redacted]d by LMP being seen today for her first obstetrical visit.  Her obstetrical history is not significant. Patient does intend to breast feed. Pregnancy history fully reviewed.  Patient reports fatigue and nausea.  HISTORY: OB History  Gravida Para Term Preterm AB Living  1 0 0 0 0 0  SAB TAB Ectopic Multiple Live Births  0 0 0 0 0    # Outcome Date GA Lbr Len/2nd Weight Sex Delivery Anes PTL Lv  1 Current            Last pap smear was  07/13/2017 and was normal Past Medical History:  Diagnosis Date  . Asthma   . Ovarian cyst    Past Surgical History:  Procedure Laterality Date  . WISDOM TOOTH EXTRACTION     Family History  Problem Relation Age of Onset  . Diabetes Mother   . Heart disease Mother   . Cancer Mother        bladder  . Diabetes Father   . Cancer Paternal Grandfather        melonoma  . Diabetes Maternal Grandmother   . Cancer Maternal Grandmother        cervical   Social History   Tobacco Use  . Smoking status: Never Smoker  . Smokeless tobacco: Never Used  Substance Use Topics  . Alcohol use: Yes    Alcohol/week: 1.0 standard drinks    Types: 1 Cans of beer per week    Comment: social  . Drug use: No   No Known Allergies Current Outpatient Medications on File Prior to Visit  Medication Sig Dispense Refill  . prenatal vitamin w/FE, FA (PRENATAL 1 + 1) 27-1 MG TABS tablet Take 1 tablet by mouth daily at 12 noon.     No current facility-administered medications on file prior to visit.      Exam   There were no vitals filed for this visit.    System: General: well-developed, well-nourished female in no acute distress   Skin: normal coloration and turgor, no rashes   Neurologic: oriented, normal, negative, normal mood   Extremities: normal strength, tone, and muscle mass, ROM of all joints is normal   HEENT sclera clear, anicteric   Mouth/Teeth mucous membranes moist, pharynx  normal without lesions and dental hygiene good   Neck supple and no masses   Cardiovascular: regular rate and rhythm   Respiratory:  no respiratory distress, normal breath sounds   Abdomen: soft, non-tender; bowel sounds normal; no masses,  no organomegaly    U/s reveals SIUP + flicker, + movement Assessment:   Pregnancy: G1P0000 Patient Active Problem List   Diagnosis Date Noted  . Supervision of low-risk pregnancy 03/07/2019  . Other and unspecified ovarian cyst 01/17/2013  . Asthma in adult 01/17/2013     Plan:  1. Encounter for supervision of low-risk pregnancy in first trimester Considering genetic testing - Obstetric Panel, Including HIV - Culture, OB Urine - GC/Chlamydia probe amp ([place])not at [facility] - Flu Vaccine QUAD 36+ mos IM (Fluarix, Quad PF) - [place] MFM OB COMP + 14 WK; Future - Enroll Patient in Babyscripts - Babyscripts Schedule Optimization  2. Mild intermittent asthma in adult without complication Refilled Albuterol - albuterol (VENTOLIN HFA) 108 (90 Base) MCG/ACT inhaler; Inhale 2 puffs into the lungs every 6 (six) hours as needed for wheezing or shortness of breath.  Dispense: 18 g; Refill: 1  Initial labs drawn. Continue prenatal vitamins. Genetic Screening discussed, NIPS: undecided. Ultrasound discussed; fetal anatomic survey: ordered. Problem list reviewed and updated. The nature of [place] with multiple MDs and other Advanced Practice Providers was explained to patient; also emphasized that residents, students are part of our team. Routine obstetric precautions reviewed. Return in 4 weeks (on 04/04/2019).

## 2019-03-07 NOTE — Patient Instructions (Signed)

## 2019-03-07 NOTE — Progress Notes (Signed)
DATING AND VIABILITY SONOGRAM   Erin Whitney is a 28 y.o. year old G1P0000 with LMP Patient's last menstrual period was 12/22/2018 (exact date). which would correlate to  [redacted]w[redacted]d weeks gestation.  She has regular menstrual cycles.   She is here today for a confirmatory initial sonogram.    GESTATION: SINGLETON yes     FETAL ACTIVITY:          Heart rate     164          The fetus is active.    GESTATIONAL AGE AND  BIOMETRICS:  Gestational criteria: Estimated Date of Delivery: 09/28/19 by LMP now at [redacted]w[redacted]d  Previous Scans:0  GESTATIONAL SAC            mm          weeks  CROWN RUMP LENGTH          4.22 mm         11.0 weeks                                                   AVERAGE EGA(BY THIS SCAN): 11.0 weeks  WORKING EDD( LMP ):  09/28/2019     TECHNICIAN COMMENTS: Patient informed that the ultrasound is considered a limited obstetric ultrasound and is not intended to be a complete ultrasound exam. Patient also informed that the ultrasound is not being completed with the intent of assessing for fetal or placental anomalies or any pelvic abnormalities. Explained that the purpose of today's ultrasound is to assess for fetal heart rate. Patient acknowledges the purpose of the exam and the limitations of the study.     A copy of this report including all images has been saved and backed up to a second source for retrieval if needed. All measures and details of the anatomical scan, placentation, fluid volume and pelvic anatomy are contained in that report.  Crosby Oyster 03/07/2019 9:07 AM

## 2019-03-08 LAB — OBSTETRIC PANEL, INCLUDING HIV
Antibody Screen: NEGATIVE
Basophils Absolute: 0 10*3/uL (ref 0.0–0.2)
Basos: 0 %
EOS (ABSOLUTE): 0.1 10*3/uL (ref 0.0–0.4)
Eos: 1 %
HIV Screen 4th Generation wRfx: NONREACTIVE
Hematocrit: 40.9 % (ref 34.0–46.6)
Hemoglobin: 14.4 g/dL (ref 11.1–15.9)
Hepatitis B Surface Ag: NEGATIVE
Immature Grans (Abs): 0 10*3/uL (ref 0.0–0.1)
Immature Granulocytes: 0 %
Lymphocytes Absolute: 1.7 10*3/uL (ref 0.7–3.1)
Lymphs: 18 %
MCH: 31.8 pg (ref 26.6–33.0)
MCHC: 35.2 g/dL (ref 31.5–35.7)
MCV: 90 fL (ref 79–97)
Monocytes Absolute: 0.7 10*3/uL (ref 0.1–0.9)
Monocytes: 7 %
Neutrophils Absolute: 6.7 10*3/uL (ref 1.4–7.0)
Neutrophils: 74 %
Platelets: 231 10*3/uL (ref 150–450)
RBC: 4.53 x10E6/uL (ref 3.77–5.28)
RDW: 11.9 % (ref 11.7–15.4)
RPR Ser Ql: NONREACTIVE
Rh Factor: POSITIVE
Rubella Antibodies, IGG: 2.04 index (ref >0.99)
WBC: 9.1 10*3/uL (ref 3.4–10.8)

## 2019-03-09 LAB — GC/CHLAMYDIA PROBE AMP (~~LOC~~) NOT AT ARMC
Chlamydia: NEGATIVE
Comment: NEGATIVE
Comment: NORMAL
Neisseria Gonorrhea: NEGATIVE

## 2019-03-11 LAB — CULTURE, OB URINE

## 2019-03-11 LAB — URINE CULTURE, OB REFLEX

## 2019-03-13 ENCOUNTER — Other Ambulatory Visit: Payer: Self-pay | Admitting: Family Medicine

## 2019-03-13 DIAGNOSIS — R8271 Bacteriuria: Secondary | ICD-10-CM | POA: Insufficient documentation

## 2019-03-13 DIAGNOSIS — Z3491 Encounter for supervision of normal pregnancy, unspecified, first trimester: Secondary | ICD-10-CM

## 2019-03-13 MED ORDER — AMOXICILLIN 500 MG PO CAPS: 500.0000 mg | ORAL_CAPSULE | Freq: Three times a day (TID) | ORAL | 0 refills | Status: DC

## 2019-04-04 ENCOUNTER — Other Ambulatory Visit: Payer: Self-pay

## 2019-04-04 ENCOUNTER — Other Ambulatory Visit (HOSPITAL_COMMUNITY): Payer: Self-pay | Admitting: Obstetrics & Gynecology

## 2019-04-04 ENCOUNTER — Ambulatory Visit (INDEPENDENT_AMBULATORY_CARE_PROVIDER_SITE_OTHER): Payer: BC Managed Care – PPO | Admitting: Obstetrics & Gynecology

## 2019-04-04 VITALS — BP 138/81 | HR 106 | Wt 209.0 lb

## 2019-04-04 DIAGNOSIS — O99210 Obesity complicating pregnancy, unspecified trimester: Secondary | ICD-10-CM | POA: Insufficient documentation

## 2019-04-04 DIAGNOSIS — Z3A14 14 weeks gestation of pregnancy: Secondary | ICD-10-CM

## 2019-04-04 DIAGNOSIS — O99212 Obesity complicating pregnancy, second trimester: Secondary | ICD-10-CM

## 2019-04-04 DIAGNOSIS — Z3492 Encounter for supervision of normal pregnancy, unspecified, second trimester: Secondary | ICD-10-CM

## 2019-04-04 NOTE — Progress Notes (Signed)
   PRENATAL VISIT NOTE  Subjective:  Erin Whitney is a 28 y.o. G1P0000 at [redacted]w[redacted]d being seen today for ongoing prenatal care.  She is currently monitored for the following issues for this low-risk pregnancy and has Other and unspecified ovarian cyst; Asthma in adult; Supervision of low-risk pregnancy; Group B streptococcal bacteriuria; and Obesity in pregnancy on their problem list.  Patient reports no complaints.  Contractions: Not present.  .  Movement: Absent. Denies leaking of fluid.   The following portions of the patient's history were reviewed and updated as appropriate: allergies, current medications, past family history, past medical history, past social history, past surgical history and problem list.   Objective:   Vitals:   04/04/19 1525  BP: 138/81  Pulse: (!) 106  Weight: 209 lb (94.8 kg)    Fetal Status: Fetal Heart Rate (bpm): 156   Movement: Absent     General:  Alert, oriented and cooperative. Patient is in no acute distress.  Skin: Skin is warm and dry. No rash noted.   Cardiovascular: Normal heart rate noted  Respiratory: Normal respiratory effort, no problems with respiration noted  Abdomen: Soft, gravid, appropriate for gestational age.  Pain/Pressure: Absent     Pelvic: Cervical exam deferred        Extremities: Normal range of motion.  Edema: None  Mental Status: Normal mood and affect. Normal behavior. Normal judgment and thought content.   Assessment and Plan:  Pregnancy: G1P0000 at [redacted]w[redacted]d 1. Obesity in pregnancy - She declines nutrition consult, discussed ACOG recs for weight gain and risks - Hemoglobin A1c - Protein / creatinine ratio, urine - Comprehensive metabolic panel  2. Encounter for supervision of low-risk pregnancy in second trimester  Preterm labor symptoms and general obstetric precautions including but not limited to vaginal bleeding, contractions, leaking of fluid and fetal movement were reviewed in detail with the patient. Please  refer to After Visit Summary for other counseling recommendations.   No follow-ups on file.  Future Appointments  Date Time Provider [place]  05/04/2019  2:30 PM WH-MFC [place] 1 WH-MFCUS MFC-US    Emily Filbert, MD

## 2019-04-05 LAB — HEMOGLOBIN A1C
Est. average glucose Bld gHb Est-mCnc: 85 mg/dL
Hgb A1c MFr Bld: 4.6 % — ABNORMAL LOW (ref 4.8–5.6)

## 2019-04-05 LAB — COMPREHENSIVE METABOLIC PANEL
ALT: 26 IU/L (ref 0–32)
AST: 26 IU/L (ref 0–40)
Albumin/Globulin Ratio: 1.4 (ref 1.2–2.2)
Albumin: 4.2 g/dL (ref 3.9–5.0)
Alkaline Phosphatase: 83 IU/L (ref 39–117)
BUN/Creatinine Ratio: 12 (ref 9–23)
BUN: 10 mg/dL (ref 6–20)
Bilirubin Total: 0.2 mg/dL (ref 0.0–1.2)
CO2: 19 mmol/L — ABNORMAL LOW (ref 20–29)
Calcium: 9.4 mg/dL (ref 8.7–10.2)
Chloride: 99 mmol/L (ref 96–106)
Creatinine, Ser: 0.82 mg/dL (ref 0.57–1.00)
GFR calc Af Amer: 113 mL/min/{1.73_m2} (ref >59)
GFR calc non Af Amer: 98 mL/min/{1.73_m2} (ref >59)
Globulin, Total: 3 g/dL (ref 1.5–4.5)
Glucose: 135 mg/dL — ABNORMAL HIGH (ref 65–99)
Potassium: 3.5 mmol/L (ref 3.5–5.2)
Sodium: 136 mmol/L (ref 134–144)
Total Protein: 7.2 g/dL (ref 6.0–8.5)

## 2019-04-05 LAB — PROTEIN / CREATININE RATIO, URINE
Creatinine, Urine: 85.9 mg/dL
Protein, Ur: 7.5 mg/dL
Protein/Creat Ratio: 87 mg/g creat (ref 0–200)

## 2019-04-26 ENCOUNTER — Emergency Department (HOSPITAL_COMMUNITY)
Admission: AD | Admit: 2019-04-26 | Discharge: 2019-04-27 | Disposition: A | Discharge status: Home / Self Care | Payer: BC Managed Care – PPO | Attending: Emergency Medicine | Admitting: Emergency Medicine

## 2019-04-26 ENCOUNTER — Encounter (HOSPITAL_COMMUNITY): Payer: Self-pay | Admitting: Emergency Medicine

## 2019-04-26 ENCOUNTER — Other Ambulatory Visit: Payer: Self-pay

## 2019-04-26 DIAGNOSIS — Z3A17 17 weeks gestation of pregnancy: Secondary | ICD-10-CM | POA: Diagnosis not present

## 2019-04-26 DIAGNOSIS — R072 Precordial pain: Secondary | ICD-10-CM

## 2019-04-26 DIAGNOSIS — R0602 Shortness of breath: Secondary | ICD-10-CM | POA: Insufficient documentation

## 2019-04-26 DIAGNOSIS — R9431 Abnormal electrocardiogram [ECG] [EKG]: Secondary | ICD-10-CM

## 2019-04-26 DIAGNOSIS — Z20828 Contact with and (suspected) exposure to other viral communicable diseases: Secondary | ICD-10-CM | POA: Insufficient documentation

## 2019-04-26 DIAGNOSIS — O99512 Diseases of the respiratory system complicating pregnancy, second trimester: Secondary | ICD-10-CM | POA: Insufficient documentation

## 2019-04-26 DIAGNOSIS — J45909 Unspecified asthma, uncomplicated: Secondary | ICD-10-CM | POA: Insufficient documentation

## 2019-04-26 DIAGNOSIS — R079 Chest pain, unspecified: Secondary | ICD-10-CM | POA: Diagnosis present

## 2019-04-26 DIAGNOSIS — Z3492 Encounter for supervision of normal pregnancy, unspecified, second trimester: Secondary | ICD-10-CM

## 2019-04-26 DIAGNOSIS — O99891 Other specified diseases and conditions complicating pregnancy: Secondary | ICD-10-CM | POA: Diagnosis not present

## 2019-04-26 LAB — BASIC METABOLIC PANEL
Anion gap: 9 (ref 5–15)
BUN: 7 mg/dL (ref 6–20)
CO2: 24 mmol/L (ref 22–32)
Calcium: 9.1 mg/dL (ref 8.9–10.3)
Chloride: 104 mmol/L (ref 98–111)
Creatinine, Ser: 0.7 mg/dL (ref 0.44–1.00)
GFR calc Af Amer: >60 mL/min (ref >60)
GFR calc non Af Amer: >60 mL/min (ref >60)
Glucose, Bld: 85 mg/dL (ref 70–99)
Potassium: 3.9 mmol/L (ref 3.5–5.1)
Sodium: 137 mmol/L (ref 135–145)

## 2019-04-26 LAB — CBC
HCT: 36.6 % (ref 36.0–46.0)
Hemoglobin: 12.6 g/dL (ref 12.0–15.0)
MCH: 31.8 pg (ref 26.0–34.0)
MCHC: 34.4 g/dL (ref 30.0–36.0)
MCV: 92.4 fL (ref 80.0–100.0)
Platelets: 192 10*3/uL (ref 150–400)
RBC: 3.96 MIL/uL (ref 3.87–5.11)
RDW: 11.6 % (ref 11.5–15.5)
WBC: 9.7 10*3/uL (ref 4.0–10.5)
nRBC: 0 % (ref 0.0–0.2)

## 2019-04-26 LAB — TROPONIN I (HIGH SENSITIVITY)
Troponin I (High Sensitivity): 4 ng/L (ref <18)
Troponin I (High Sensitivity): 3 ng/L (ref <18)

## 2019-04-26 MED ORDER — SODIUM CHLORIDE 0.9% FLUSH
3.0000 mL | Freq: Once | INTRAVENOUS | Status: AC
  Administered 2019-04-27: 3 mL via INTRAVENOUS

## 2019-04-26 NOTE — MAU Provider Note (Signed)
First Provider Initiated Contact with Patient 04/26/19 1518      S Ms. Erin Whitney is a 28 y.o. G1P0000 non-pregnant female who presents to MAU today with complaint of chest pain & shortness of breath. Symptoms started this morning. Reports chest tightness. Feels short of breath & feels like she's raspy. Has a history of asthma but states this does not feel like her asthma. Denies fever, cough, or sore throat. Denies abdominal pain or vaginal bleeding.    O BP 139/79 (BP Location: Right Arm)   Pulse (!) 102   Temp 98.3 F (36.8 C) (Oral)   Resp 16   LMP 12/22/2018 (Exact Date)   SpO2 100%  Physical Exam  Nursing note and vitals reviewed. Constitutional: She appears well-developed and well-nourished. No distress.  HENT:  Head: Normocephalic and atraumatic.  Respiratory: Effort normal and breath sounds normal. No respiratory distress.  Skin: She is not diaphoretic.  Psychiatric: She has a normal mood and affect. Her behavior is normal. Thought content normal.   FHT 159 by doppler   A 1. Chest pain, unspecified type   2. Shortness of breath   3. [redacted] weeks gestation of pregnancy   4. Presence of fetal heart sounds in second trimester      P Patient without OB complaints; fetal heart tones present Transfer to [facility] Dr. Eulis Foster (ED physician) notified of patient transfer  Jorje Guild, NP 04/26/2019 3:18 PM

## 2019-04-26 NOTE — MAU Note (Signed)
Report given to [facility] in [place].   Transport notified of patient.  Patient to wait in the lobby for transport.

## 2019-04-26 NOTE — MAU Note (Signed)
Erin Whitney is a 28 y.o. at [redacted]w[redacted]d here in MAU reporting: this morning started having tightness in her chest, feels it a lot when she bends over. Feels a little bit SOB, states she can take a deep breath but it feels raspy. No abdominal pain, vaginal bleeding, or LOF.  Onset of complaint: today  Pain score: 5/10  Vitals:   04/26/19 1506  BP: 139/79  Pulse: (!) 102  Resp: 16  Temp: 98.3 F (36.8 C)  SpO2: 100%     FHT: 159  Lab orders placed from triage: none at this time

## 2019-04-26 NOTE — ED Triage Notes (Signed)
Pt sent from MAU.  C/o pain to center of chest that radiates to L shoulder that started this morning, resolved, and then pain returned this afternoon.  Also reports SOB.  Denies nausea and vomiting.  Pt 17w 6d pregnant.

## 2019-04-27 ENCOUNTER — Encounter (HOSPITAL_COMMUNITY): Payer: Self-pay | Admitting: Obstetrics and Gynecology

## 2019-04-27 ENCOUNTER — Emergency Department (HOSPITAL_COMMUNITY): Payer: BC Managed Care – PPO

## 2019-04-27 LAB — POC SARS CORONAVIRUS 2 AG -  ED: SARS Coronavirus 2 Ag: NEGATIVE

## 2019-04-27 MED ORDER — IOHEXOL 350 MG/ML SOLN
75.0000 mL | Freq: Once | INTRAVENOUS | Status: AC | PRN
  Administered 2019-04-27: 04:00:00 75 mL via INTRAVENOUS

## 2019-04-27 NOTE — ED Provider Notes (Signed)
[facility] EMERGENCY DEPARTMENT Provider Note   CSN: [id number] Arrival date & time: 04/26/19  1544     History Chief Complaint  Patient presents with  . Chest Pain  . Shortness of Breath    Erin Whitney is a 28 y.o. female.  The history is provided by the patient.  Chest Pain Pain location:  Substernal area Pain quality: tightness   Pain radiates to:  Does not radiate Pain severity:  Moderate Onset quality:  Sudden Timing:  Constant Progression:  Resolved Chronicity:  New Relieved by:  Nothing Worsened by:  Nothing Ineffective treatments:  None tried Associated symptoms: shortness of breath   Associated symptoms: no abdominal pain, no anorexia, no back pain, no cough, no dizziness, no fever, no lower extremity edema, no nausea, no palpitations and no vomiting   Risk factors: pregnancy   Patient is [redacted] weeks pregnant and was sent from the [facility]U for SOB and CP.  No f/c/r.  No travel.  No leg pain.  No anosmia.       Past Medical History:  Diagnosis Date  . Asthma   . Ovarian cyst     Patient Active Problem List   Diagnosis Date Noted  . Obesity in pregnancy 04/04/2019  . Group B streptococcal bacteriuria 03/13/2019  . Supervision of low-risk pregnancy 03/07/2019  . Other and unspecified ovarian cyst 01/17/2013  . Asthma in adult 01/17/2013    Past Surgical History:  Procedure Laterality Date  . WISDOM TOOTH EXTRACTION       OB History    Gravida  1   Para  0   Term  0   Preterm  0   AB  0   Living  0     SAB  0   TAB  0   Ectopic  0   Multiple  0   Live Births              Family History  Problem Relation Age of Onset  . Diabetes Mother   . Heart disease Mother   . Cancer Mother        bladder  . Diabetes Father   . Cancer Paternal Grandfather        melonoma  . Diabetes Maternal Grandmother   . Cancer Maternal Grandmother        cervical    Social History   Tobacco Use  . Smoking status: Never  Smoker  . Smokeless tobacco: Never Used  Substance Use Topics  . Alcohol use: Yes    Alcohol/week: 1.0 standard drinks    Types: 1 Cans of beer per week    Comment: social  . Drug use: No    Home Medications Prior to Admission medications   Medication Sig Start Date End Date Taking? Authorizing Provider  albuterol (VENTOLIN HFA) 108 (90 Base) MCG/ACT inhaler Inhale 2 puffs into the lungs every 6 (six) hours as needed for wheezing or shortness of breath. Patient not taking: Reported on 04/04/2019 03/07/19   Donnamae Jude, MD  prenatal vitamin w/FE, FA (PRENATAL 1 + 1) 27-1 MG TABS tablet Take 1 tablet by mouth daily at 12 noon.    [provider]    Allergies    Patient has no known allergies.  Review of Systems   Review of Systems  Constitutional: Negative for fever.  HENT: Negative for congestion.   Eyes: Negative for visual disturbance.  Respiratory: Positive for shortness of breath. Negative for cough and  wheezing.   Cardiovascular: Positive for chest pain. Negative for palpitations.  Gastrointestinal: Negative for abdominal pain, anorexia, nausea and vomiting.  Endocrine: Negative for polyuria.  Genitourinary: Negative for difficulty urinating, vaginal bleeding and vaginal discharge.  Musculoskeletal: Negative for back pain.  Neurological: Negative for dizziness.  All other systems reviewed and are negative.   Physical Exam Updated Vital Signs BP 125/76 (BP Location: Right Arm)   Pulse 87   Temp 98.1 F (36.7 C) (Oral)   Resp 16   LMP 12/22/2018 (Exact Date)   SpO2 100%   Physical Exam Vitals and nursing note reviewed.  Constitutional:      General: She is not in acute distress.    Appearance: Normal appearance.  HENT:     Head: Normocephalic and atraumatic.     Nose: Nose normal.  Eyes:     Conjunctiva/sclera: Conjunctivae normal.     Pupils: Pupils are equal, round, and reactive to light.  Cardiovascular:     Rate and Rhythm: Normal rate and  regular rhythm.     Pulses: Normal pulses.     Heart sounds: Normal heart sounds.  Pulmonary:     Effort: Pulmonary effort is normal.     Breath sounds: Normal breath sounds.  Abdominal:     General: Abdomen is flat. Bowel sounds are normal.     Tenderness: There is no abdominal tenderness. There is no guarding or rebound.  Musculoskeletal:        General: Normal range of motion.     Cervical back: Normal range of motion and neck supple.  Skin:    General: Skin is warm and dry.     Capillary Refill: Capillary refill takes less than 2 seconds.  Neurological:     General: No focal deficit present.     Mental Status: She is alert and oriented to person, place, and time.     Deep Tendon Reflexes: Reflexes normal.  Psychiatric:        Mood and Affect: Mood normal.        Behavior: Behavior normal.     ED Results / Procedures / Treatments   Labs (all labs ordered are listed, but only abnormal results are displayed) Results for orders placed or performed during the hospital encounter of 0000000  Basic metabolic panel  Result Value Ref Range   Sodium 137 135 - 145 mmol/L   Potassium 3.9 3.5 - 5.1 mmol/L   Chloride 104 98 - 111 mmol/L   CO2 24 22 - 32 mmol/L   Glucose, Bld 85 70 - 99 mg/dL   BUN 7 6 - 20 mg/dL   Creatinine, Ser 0.70 0.44 - 1.00 mg/dL   Calcium 9.1 8.9 - 10.3 mg/dL   GFR calc non Af Amer >60 >60 mL/min   GFR calc Af Amer >60 >60 mL/min   Anion gap 9 5 - 15  CBC  Result Value Ref Range   WBC 9.7 4.0 - 10.5 K/uL   RBC 3.96 3.87 - 5.11 MIL/uL   Hemoglobin 12.6 12.0 - 15.0 g/dL   HCT 36.6 36.0 - 46.0 %   MCV 92.4 80.0 - 100.0 fL   MCH 31.8 26.0 - 34.0 pg   MCHC 34.4 30.0 - 36.0 g/dL   RDW 11.6 11.5 - 15.5 %   Platelets 192 150 - 400 K/uL   nRBC 0.0 0.0 - 0.2 %  POC SARS Coronavirus 2 Ag-ED -  Result Value Ref Range   SARS Coronavirus 2 Ag NEGATIVE NEGATIVE  Troponin I (High Sensitivity)  Result Value Ref Range   Troponin I (High Sensitivity) 4 <18 ng/L   Troponin I (High Sensitivity)  Result Value Ref Range   Troponin I (High Sensitivity) 3 <18 ng/L   No results found.  EKG EKG Interpretation  Date/Time:  Wednesday April 26 2019 15:52:43 EST Ventricular Rate:  93 PR Interval:  150 QRS Duration: 84 QT Interval:  362 QTC Calculation: 450 R Axis:   92 Text Interpretation: Normal sinus rhythm Rightward axis Non specific st t changes Confirmed by Randal Buba, Saya Mccoll ([id number]) on 04/27/2019 1:01:47 AM   Radiology No results found.  Procedures Procedures (including critical care time)  Medications Ordered in ED Medications  sodium chloride flush (NS) 0.9 % injection 3 mL (has no administration in time range)   202 case d/w Dr. Rip Harbour of OB on call for Dr. Kennon Rounds.  OB routinel does CTAs on pregnant women 4-5 times a week as the risk of a blood clot is higher than the risk of radiation  208 case d/w Dr. Joelyn Oms, radiology will dose modify and shield the abdomen and the risk is much less after organogenesis.  Rads routing Cts abdomens in the 3rd trimester  This information was given to the patient and her husband in order to do shared decision making of risks and benefits but they had already decided to proceed with CTA as they discussed this with the patient's mother who is a nurse and advised them to have the CTA.    The patient and her husband have been advised of the risk benefits and alternatives and they have made the decision to go to CTA.  The test was ordered based on the patient's decision.    ED Course  EKG is not completely normal. The patient has ruled out for MI.  Her heart score is 1 very low risk for MACE.  I will refer to cardiology for follow up of the EKG.  Patient verbalizes understanding and agrees to follow up  Erin Whitney was evaluated in Emergency Department on 04/27/2019 for the symptoms described in the history of present illness. She was evaluated in the context of the global COVID-19 pandemic, which  necessitated consideration that the patient might be at risk for infection with the SARS-CoV-2 virus that causes COVID-19. Institutional protocols and algorithms that pertain to the evaluation of patients at risk for COVID-19 are in a state of rapid change based on information released by regulatory bodies including the CDC and federal and state organizations. These policies and algorithms were followed during the patient's care in the ED.       Final Clinical Impression(s) / ED Diagnoses Final diagnoses:  Chest pain, unspecified type  Shortness of breath  [redacted] weeks gestation of pregnancy  Presence of fetal heart sounds in second trimester   Return for intractable cough, coughing up blood,fevers >100.4 unrelieved by medication, shortness of breath, intractable vomiting, chest pain, shortness of breath, weakness,numbness, changes in speech, facial asymmetry,abdominal pain, passing out,Inability to tolerate liquids or food, cough, altered mental status or any concerns. No signs of systemic illness or infection. The patient is nontoxic-appearing on exam and vital signs are within normal limits.   I have reviewed the triage vital signs and the nursing notes. Pertinent labs &imaging results that were available during my care of the patient were reviewed by me and considered in my medical decision making (see chart for details).  After history, exam, and medical workup I feel the patient has  been appropriately medically screened and is safe for discharge home. Pertinent diagnoses were discussed with the patient. Patient was given return precautions    Garen Woolbright, MD 04/27/19 [phone]

## 2019-04-27 NOTE — ED Notes (Signed)
Pt walked on spo2, pt stayed above 98%. Strong gait

## 2019-04-30 NOTE — Progress Notes (Signed)
Cardiology Office Note:    Date:  05/01/2019   ID:  [name], DOB 02-11-1991, MRN [id number]  PCP:  Maryland Pink, MD  Cardiologist:  No primary care provider on file.  Electrophysiologist:  None   Referring MD: Maryland Pink, MD   Chief Complaint  Patient presents with  . Chest Pain    thursday     History of Present Illness:    Erin Whitney is a 28 y.o. female with a hx of asthma who is referred by Dr. Kary Kos for evaluation of chest pain and shortness of breath.  She is currently [redacted] weeks pregnant.  She was seen on 04/26/2019 in the ED for chest pain and shortness of breath.  CTPA was done which was negative for PE or other acute finding.  EKG notable for right axis deviation, inferior T wave inversions, ST depressions V3-6, II, III, aVF.  High-sensitivity troponins negative x2.  Since discharge from the ED, patient reports she had 1 additional episode of chest pain.  She describes the chest pain as squeezing pain on the left side of her chest, radiating to left shoulder.  Episode that prompted her to go to the ED lasted for about 2 hours.  Second episode lasted 30 to 45 minutes.  She denies any exertional chest pain, but does report significant shortness of breath with exertion.  Reports that she becomes short of breath and feels like heart is racing with minimal exertion.  Reports asthma is well controlled, uses rescue inhaler infrequently.  No smoking history.  Mother diagnosed with AF in 15s.   Past Medical History:  Diagnosis Date  . Asthma   . Ovarian cyst     Past Surgical History:  Procedure Laterality Date  . WISDOM TOOTH EXTRACTION      Current Medications: Current Meds  Medication Sig  . albuterol (VENTOLIN HFA) 108 (90 Base) MCG/ACT inhaler Inhale 2 puffs into the lungs every 6 (six) hours as needed for wheezing or shortness of breath.     Allergies:   Patient has no known allergies.   Social History   Socioeconomic History  . Marital status:  Married    Spouse name: Not on file  . Number of children: Not on file  . Years of education: 4  . Highest education level: Not on file  Occupational History  . Occupation: Pharmacist, hospital    Comment: Kindergarten-[employer]  Tobacco Use  . Smoking status: Never Smoker  . Smokeless tobacco: Never Used  Substance and Sexual Activity  . Alcohol use: Yes    Alcohol/week: 1.0 standard drinks    Types: 1 Cans of beer per week    Comment: social  . Drug use: No  . Sexual activity: Yes    Birth control/protection: None  Other Topics Concern  . Not on file  Social History Narrative  . Not on file   Social Determinants of Health   Financial Resource Strain:   . Difficulty of Paying Living Expenses: Not on file  Food Insecurity:   . Worried About Charity fundraiser in the Last Year: Not on file  . Ran Out of Food in the Last Year: Not on file  Transportation Needs:   . Lack of Transportation (Medical): Not on file  . Lack of Transportation (Non-Medical): Not on file  Physical Activity:   . Days of Exercise per Week: Not on file  . Minutes of Exercise per Session: Not on file  Stress:   . Feeling of  Stress : Not on file  Social Connections:   . Frequency of Communication with Friends and Family: Not on file  . Frequency of Social Gatherings with Friends and Family: Not on file  . Attends Religious Services: Not on file  . Active Member of Clubs or Organizations: Not on file  . Attends Archivist Meetings: Not on file  . Marital Status: Not on file     Family History: The patient's family history includes Cancer in her maternal grandmother, mother, and paternal grandfather; Diabetes in her father, maternal grandmother, and mother; Heart disease in her mother.  ROS:   Please see the history of present illness.     All other systems reviewed and are negative.  EKGs/Labs/Other Studies Reviewed:    The following studies were reviewed today:   EKG:  EKG is ordered today.   The ekg ordered today demonstrates sinus tachycardia, rate 123, T wave flattening in leads III, aVF, V5-6  Recent Labs: 04/04/2019: ALT 26 04/26/2019: BUN 7; Creatinine, Ser 0.70; Hemoglobin 12.6; Platelets 192; Potassium 3.9; Sodium 137  Recent Lipid Panel No results found for: CHOL, TRIG, HDL, CHOLHDL, VLDL, LDLCALC, LDLDIRECT  Physical Exam:    VS:  BP 132/84   Pulse (!) 127   Temp 97.8 F (36.6 C)   Ht 5\' 10"  (1.778 m)   Wt 209 lb 9.6 oz (95.1 kg)   LMP 12/22/2018 (Exact Date)   BMI 30.07 kg/m     Wt Readings from Last 3 Encounters:  05/01/19 209 lb 9.6 oz (95.1 kg)  04/04/19 209 lb (94.8 kg)  03/07/19 205 lb (93 kg)     GEN:  Well nourished, well developed in no acute distress HEENT: Normal NECK: No JVD LYMPHATICS: No lymphadenopathy CARDIAC: RRR, no murmurs, rubs, gallops RESPIRATORY:  Clear to auscultation without rales, wheezing or rhonchi  ABDOMEN: Soft, non-tender, non-distended MUSCULOSKELETAL:  No edema; No deformity  SKIN: Warm and dry NEUROLOGIC:  Alert and oriented x 3 PSYCHIATRIC:  Normal affect   ASSESSMENT:    1. SOB (shortness of breath)   2. Chest pain of uncertain etiology    PLAN:    In order of problems listed above:  Chest pain/dyspnea: Given presentation to the ED on 12/9 with continuous chest pain and negative high-sensitivity troponins, suspect chest pain is noncardiac in etiology.  Did have nonspecific EKG changes which are improved on EKG today.  CTPA was negative for PE.  Dyspnea on exertion could be related to pregnancy, but will check TTE to rule out structural heart disease  Medication Adjustments/Labs and Tests Ordered: Current medicines are reviewed at length with the patient today.  Concerns regarding medicines are outlined above.  Orders Placed This Encounter  Procedures  . EKG 12-Lead  . ECHOCARDIOGRAM COMPLETE   No orders of the defined types were placed in this encounter.   Patient Instructions  Medication  Instructions:  No changes   Lab Work: None ordered   Testing/Procedures: Schedule Echo  Follow-Up: At [facility], you and your health needs are our priority.  As part of our continuing mission to provide you with exceptional heart care, we have created designated Provider Care Teams.  These Care Teams include your primary Cardiologist (physician) and Advanced Practice Providers (APPs -  Physician Assistants and Nurse Practitioners) who all work together to provide you with the care you need, when you need it.  Your next appointment:  Follow up with Dr.Zakariyya Helfman as needed      Signed, Harrell Gave  Fritz Pickerel, MD  05/01/2019 8:56 PM    [place]

## 2019-05-01 ENCOUNTER — Encounter: Payer: Self-pay | Admitting: Cardiology

## 2019-05-01 ENCOUNTER — Other Ambulatory Visit: Payer: Self-pay

## 2019-05-01 ENCOUNTER — Ambulatory Visit: Payer: BC Managed Care – PPO | Admitting: Cardiology

## 2019-05-01 VITALS — BP 132/84 | HR 127 | Temp 97.8°F | Ht 70.0 in | Wt 209.6 lb

## 2019-05-01 DIAGNOSIS — R0602 Shortness of breath: Secondary | ICD-10-CM | POA: Diagnosis not present

## 2019-05-01 DIAGNOSIS — R079 Chest pain, unspecified: Secondary | ICD-10-CM | POA: Diagnosis not present

## 2019-05-01 NOTE — Patient Instructions (Signed)
Medication Instructions:  No changes   Lab Work: None ordered   Testing/Procedures: Schedule Echo  Follow-Up: At [facility], you and your health needs are our priority.  As part of our continuing mission to provide you with exceptional heart care, we have created designated Provider Care Teams.  These Care Teams include your primary Cardiologist (physician) and Advanced Practice Providers (APPs -  Physician Assistants and Nurse Practitioners) who all work together to provide you with the care you need, when you need it.  Your next appointment:  Follow up with Dr.Schumann as needed

## 2019-05-03 ENCOUNTER — Other Ambulatory Visit: Payer: Self-pay

## 2019-05-03 ENCOUNTER — Telehealth (INDEPENDENT_AMBULATORY_CARE_PROVIDER_SITE_OTHER): Payer: BC Managed Care – PPO | Admitting: Family Medicine

## 2019-05-03 DIAGNOSIS — R8271 Bacteriuria: Secondary | ICD-10-CM

## 2019-05-03 DIAGNOSIS — R0789 Other chest pain: Secondary | ICD-10-CM

## 2019-05-03 DIAGNOSIS — Z3492 Encounter for supervision of normal pregnancy, unspecified, second trimester: Secondary | ICD-10-CM

## 2019-05-03 DIAGNOSIS — R079 Chest pain, unspecified: Secondary | ICD-10-CM | POA: Insufficient documentation

## 2019-05-03 DIAGNOSIS — O26892 Other specified pregnancy related conditions, second trimester: Secondary | ICD-10-CM

## 2019-05-03 DIAGNOSIS — Z3A18 18 weeks gestation of pregnancy: Secondary | ICD-10-CM

## 2019-05-03 NOTE — Progress Notes (Signed)
I connected with@ on 05/03/19 at  3:15 PM EST by: Mychart and verified that I am speaking with the correct person using two identifiers.  Patient is located at home and provider is located at [facility].     The purpose of this virtual visit is to provide medical care while limiting exposure to the novel coronavirus. I discussed the limitations, risks, security and privacy concerns of performing an evaluation and management service by mychart and the availability of in person appointments. I also discussed with the patient that there may be a patient responsible charge related to this service. By engaging in this virtual visit, you consent to the provision of healthcare.  Additionally, you authorize for your insurance to be billed for the services provided during this visit.  The patient expressed understanding and agreed to proceed.  The following staff members participated in the virtual visit:  CMA    PRENATAL VISIT NOTE  Subjective:  Erin Whitney is a 28 y.o. G1P0000 at [redacted]w[redacted]d  for phone visit for ongoing prenatal care.  She is currently monitored for the following issues for this low-risk pregnancy and has Other and unspecified ovarian cyst; Asthma in adult; Supervision of low-risk pregnancy; Group B streptococcal bacteriuria; and Obesity in pregnancy on their problem list.  Patient reports persisent chest pain. Intermittent. last felt last night, mild non radiating. has noted associated racing hard. no dizzines or syncope. .  Contractions: Not present. Vag. Bleeding: None.  Movement: Present. Denies leaking of fluid.   The following portions of the patient's history were reviewed and updated as appropriate: allergies, current medications, past family history, past medical history, past social history, past surgical history and problem list.   Objective:   Vitals:   05/03/19 1531  BP: 118/77  Pulse: (!) 116  Weight: 205 lb (93 kg)   Self-Obtained  Fetal Status:     Movement: Present      Assessment and Plan:  Pregnancy: G1P0000 at [redacted]w[redacted]d  1. Group B streptococcal bacteriuria PCN in labor  2. Encounter for supervision of low-risk pregnancy in second trimester Up to date Has appt for anatomy scan tomorrow   3. 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   Preterm labor symptoms and general obstetric precautions including but not limited to vaginal bleeding, contractions, leaking of fluid and fetal movement were reviewed in detail with the patient.  Return in about 4 weeks (around 05/31/2019) for Routine prenatal care, Telehealth/Virtual health OB Visit.  Future Appointments  Date Time Provider [place]  05/04/2019  2:30 PM [facility]-MFC [place] 1 [facility]-MFCUS [facility]-US  05/16/2019  3:00 PM [facility] ECHO 4 [id number]  05/31/2019  3:15 PM Caren Macadam, MD [facility]     Time spent on virtual visit: 15 minutes  Caren Macadam, MD

## 2019-05-03 NOTE — Progress Notes (Signed)
I connected with  Erin Whitney on 05/03/19 at  3:15 PM EST by telephone and verified that I am speaking with the correct person using two identifiers.   I discussed the limitations, risks, security and privacy concerns of performing an evaluation and management service by telephone and the availability of in person appointments. I also discussed with the patient that there may be a patient responsible charge related to this service. The patient expressed understanding and agreed to proceed.  Crosby Oyster, RN 05/03/2019  3:32 PM   Was seen in ED for chest pain, saw cardio on Monday has echo scheduled on 12/29. Heart rate keeps increasing for no reason.

## 2019-05-04 ENCOUNTER — Ambulatory Visit (HOSPITAL_COMMUNITY)
Admission: RE | Admit: 2019-05-04 | Discharge: 2019-05-04 | Disposition: A | Discharge status: Home / Self Care | Payer: BC Managed Care – PPO | Source: Ambulatory Visit | Attending: Obstetrics and Gynecology | Admitting: Obstetrics and Gynecology

## 2019-05-04 DIAGNOSIS — Z3491 Encounter for supervision of normal pregnancy, unspecified, first trimester: Secondary | ICD-10-CM | POA: Insufficient documentation

## 2019-05-04 DIAGNOSIS — Z363 Encounter for antenatal screening for malformations: Secondary | ICD-10-CM | POA: Diagnosis not present

## 2019-05-04 DIAGNOSIS — Z3A19 19 weeks gestation of pregnancy: Secondary | ICD-10-CM | POA: Diagnosis not present

## 2019-05-05 ENCOUNTER — Ambulatory Visit: Payer: BC Managed Care – PPO | Attending: Internal Medicine

## 2019-05-05 DIAGNOSIS — Z20822 Contact with and (suspected) exposure to covid-19: Secondary | ICD-10-CM

## 2019-05-06 LAB — NOVEL CORONAVIRUS, NAA: SARS-CoV-2, NAA: NOT DETECTED

## 2019-05-16 ENCOUNTER — Other Ambulatory Visit: Payer: Self-pay

## 2019-05-16 ENCOUNTER — Ambulatory Visit (HOSPITAL_COMMUNITY): Payer: BC Managed Care – PPO | Attending: Cardiovascular Disease

## 2019-05-16 DIAGNOSIS — R0602 Shortness of breath: Secondary | ICD-10-CM | POA: Diagnosis not present

## 2019-05-19 NOTE — L&D Delivery Note (Addendum)
OB/GYN Faculty Practice Delivery Note  Erin Whitney is a 29 y.o. G1P0000 s/p VD at [redacted]w[redacted]d. She was admitted for IOL for gHTN.   ROM: 14h 57m with clear fluid GBS Status:  Positive/-- (10/20 0000) Maximum Maternal Temperature: 98.6 F   Labor Progress: . Patient presented to L&D for IOL for gestation hypertension. Initial SVE: 0/thick/-. She received Cytotec x4, foley bulb, AROM and pitocin.  She then progressed to complete.   Delivery Date/Time: 09/09/19  0002 Delivery: Called to room and patient was complete and pushing. Head delivered ROA. Nuchal cord present x1 and loose body cord that will manually reduced after delivery. Shoulder and body delivered in usual fashion. Infant with spontaneous cry, placed on mother's abdomen, dried and stimulated. Cord clamped x 2 after 1-minute delay, and cut by Dr. Susa Simmonds. Cord blood drawn. Placenta delivered spontaneously with gentle cord traction. Fundus firm with massage and Pitocin. Labia, perineum, vagina, and cervix inspected inspected with a right labial and first degree perineal that was repaired with 4-0 Vicryl and 3-0 Vicryl in the standard fashion.   Baby Weight: 3320 grams  Placenta: Sent to L&D Complications: prolonged labor  Lacerations: R labial, first degree perineal, both repaired EBL: 200 mL  Analgesia: Epidural  Infant: APGAR (1 MIN): 8   APGAR (5 MINS): [place], DO PGY-1, [facility] 09/09/2019 1:05 AM     OB FELLOW ATTESTATION  I was present, gloved, and supervising throughout delivery and have edited the above note to reflect any changes or updates.  Augustin Coupe, MD/[facility] OB Fellow  09/09/2019, 1:51 AM

## 2019-05-31 ENCOUNTER — Telehealth: Payer: BC Managed Care – PPO | Admitting: Family Medicine

## 2019-06-01 ENCOUNTER — Telehealth (INDEPENDENT_AMBULATORY_CARE_PROVIDER_SITE_OTHER): Payer: BC Managed Care – PPO | Admitting: Obstetrics & Gynecology

## 2019-06-01 ENCOUNTER — Other Ambulatory Visit: Payer: Self-pay

## 2019-06-01 ENCOUNTER — Encounter: Payer: Self-pay | Admitting: Obstetrics & Gynecology

## 2019-06-01 DIAGNOSIS — Z3A23 23 weeks gestation of pregnancy: Secondary | ICD-10-CM

## 2019-06-01 DIAGNOSIS — Z3492 Encounter for supervision of normal pregnancy, unspecified, second trimester: Secondary | ICD-10-CM

## 2019-06-01 NOTE — Patient Instructions (Signed)
Return to office for any scheduled appointments. Call the office or go to the MAU at [facility] if:  You begin to have strong, frequent contractions  Your water breaks.  Sometimes it is a big gush of fluid, sometimes it is just a trickle that keeps getting your panties wet or running down your legs  You have vaginal bleeding.  It is normal to have a small amount of spotting if your cervix was checked.   You do not feel your baby moving like normal.  If you do not, get something to eat and drink and lay down and focus on feeling your baby move.   If your baby is still not moving like normal, you should call the office or go to MAU.  Any other obstetric concerns.  TDaP Vaccine Pregnancy Get the Whooping Cough Vaccine While You Are Pregnant (CDC)  It is important for women to get the whooping cough vaccine in the third trimester of each pregnancy. Vaccines are the best way to prevent this disease. There are 2 different whooping cough vaccines. Both vaccines combine protection against whooping cough, tetanus and diphtheria, but they are for different age groups: Tdap: for everyone 11 years or older, including pregnant women  DTaP: for children 2 months through 6 years of age  You need the whooping cough vaccine during each of your pregnancies The recommended time to get the shot is during your 27th through 36th week of pregnancy, preferably during the earlier part of this time period. The Centers for Disease Control and Prevention (CDC) recommends that pregnant women receive the whooping cough vaccine for adolescents and adults (called Tdap vaccine) during the third trimester of each pregnancy. The recommended time to get the shot is during your 27th through 36th week of pregnancy, preferably during the earlier part of this time period. This replaces the original recommendation that pregnant women get the vaccine only if they had not previously received it. The  American College of Obstetricians and Gynecologists and the American College of Nurse-Midwives support this recommendation.  You should get the whooping cough vaccine while pregnant to pass protection to your baby frame support disabled and/or not supported in this browser  Learn why Laura decided to get the whooping cough vaccine in her 3rd trimester of pregnancy and how her baby girl was born with some protection against the disease. Also available on YouTube. After receiving the whooping cough vaccine, your body will create protective antibodies (proteins produced by the body to fight off diseases) and pass some of them to your baby before birth. These antibodies provide your baby some short-term protection against whooping cough in early life. These antibodies can also protect your baby from some of the more serious complications that come along with whooping cough. Your protective antibodies are at their highest about 2 weeks after getting the vaccine, but it takes time to pass them to your baby. So the preferred time to get the whooping cough vaccine is early in your third trimester. The amount of whooping cough antibodies in your body decreases over time. That is why CDC recommends you get a whooping cough vaccine during each pregnancy. Doing so allows each of your babies to get the greatest number of protective antibodies from you. This means each of your babies will get the best protection possible against this disease.  Getting the whooping cough vaccine while pregnant is better than getting the vaccine after you give birth Whooping cough vaccination during   pregnancy is ideal so your baby will have short-term protection as soon as he is born. This early protection is important because your baby will not start getting his whooping cough vaccines until he is 2 months old. These first few months of life are when your baby is at greatest risk for catching whooping cough. This is also when he's at  greatest risk for having severe, potentially life-threating complications from the infection. To avoid that gap in protection, it is best to get a whooping cough vaccine during pregnancy. You will then pass protection to your baby before he is born. To continue protecting your baby, he should get whooping cough vaccines starting at 2 months old. You may never have gotten the Tdap vaccine before and did not get it during this pregnancy. If so, you should make sure to get the vaccine immediately after you give birth, before leaving the hospital or birthing center. It will take about 2 weeks before your body develops protection (antibodies) in response to the vaccine. Once you have protection from the vaccine, you are less likely to give whooping cough to your newborn while caring for him. But remember, your baby will still be at risk for catching whooping cough from others. A recent study looked to see how effective Tdap was at preventing whooping cough in babies whose mothers got the vaccine while pregnant or in the hospital after giving birth. The study found that getting Tdap between 27 through 36 weeks of pregnancy is 85% more effective at preventing whooping cough in babies younger than 2 months old. Blood tests cannot tell if you need a whooping cough vaccine There are no blood tests that can tell you if you have enough antibodies in your body to protect yourself or your baby against whooping cough. Even if you have been sick with whooping cough in the past or previously received the vaccine, you still should get the vaccine during each pregnancy. Breastfeeding may pass some protective antibodies onto your baby By breastfeeding, you may pass some antibodies you have made in response to the vaccine to your baby. When you get a whooping cough vaccine during your pregnancy, you will have antibodies in your breast milk that you can share with your baby as soon as your milk comes in. However, your baby will not  get protective antibodies immediately if you wait to get the whooping cough vaccine until after delivering your baby. This is because it takes about 2 weeks for your body to create antibodies. Learn more about the health benefits of breastfeeding.  

## 2019-06-01 NOTE — Progress Notes (Signed)
I connected with  Erin Whitney on 06/01/19 at  4:15 PM EST by telephone and verified that I am speaking with the correct person using two identifiers.   I discussed the limitations, risks, security and privacy concerns of performing an evaluation and management service by telephone and the availability of in person appointments. I also discussed with the patient that there may be a patient responsible charge related to this service. The patient expressed understanding and agreed to proceed.  Crosby Oyster, RN 06/01/2019  3:41 PM

## 2019-06-01 NOTE — Progress Notes (Signed)
TELEHEALTH OBSTETRICS PRENATAL VIRTUAL VIDEO VISIT ENCOUNTER NOTE  Provider location: [facility]   I connected with Erin Whitney on 06/01/19 at  4:15 PM EST by MyChart Video Encounter at home and verified that I am speaking with the correct person using two identifiers.   I discussed the limitations, risks, security and privacy concerns of performing an evaluation and management service virtually and the availability of in person appointments. I also discussed with the patient that there may be a patient responsible charge related to this service. The patient expressed understanding and agreed to proceed. Subjective:  Erin Whitney is a 29 y.o. G1P0000 at [redacted]w[redacted]d being seen today for ongoing prenatal care.  She is currently monitored for the following issues for this low-risk pregnancy and has Other and unspecified ovarian cyst; Asthma in adult; Supervision of low-risk pregnancy; Group B streptococcal bacteriuria; Obesity in pregnancy; and Chest pain on their problem list.  Patient reports no complaints.  Contractions: Not present. Vag. Bleeding: None.  Movement: Present. Denies any leaking of fluid.   The following portions of the patient's history were reviewed and updated as appropriate: allergies, current medications, past family history, past medical history, past social history, past surgical history and problem list.   Objective:   Vitals:   06/01/19 1540  BP: 135/83  Weight: 213 lb (96.6 kg)    Fetal Status:     Movement: Present     General:  Alert, oriented and cooperative. Patient is in no acute distress.  Respiratory: Normal respiratory effort, no problems with respiration noted  Mental Status: Normal mood and affect. Normal behavior. Normal judgment and thought content.  Rest of physical exam deferred due to type of encounter  Imaging: ECHOCARDIOGRAM COMPLETE  Result Date: 05/16/2019   ECHOCARDIOGRAM REPORT   Patient Name:    Erin Whitney Date of Exam: 05/16/2019 Medical Rec #:  [id number]       Height:       70.0 in Accession #:    [id number]      Weight:       205.0 lb Date of Birth:  [birth year]        BSA:          2.11 m Patient Age:    28 years        BP:           144/85 mmHg Patient Gender: F               HR:           90 bpm. Exam Location:  [place] Procedure: 2D Echo, Cardiac Doppler and Color Doppler Indications:    R06.00 SOB  History:        Patient has no prior history of Echocardiogram examinations.                 Signs/Symptoms:Chest Pain. [redacted] weeks pregnant.  Sonographer:    Marygrace Drought RCS Referring Phys: [id number] [place]  1. Left ventricular ejection fraction, by visual estimation, is 60 to 65%. The left ventricle has normal function. There is no left ventricular hypertrophy.  2. The average left ventricular global longitudinal strain is -21.3 %.  3. The left ventricle has no regional wall motion abnormalities.  4. Global right ventricle has normal systolic function.The right ventricular size is normal. No increase in right ventricular wall thickness.  5. Left atrial size was normal.  6. Right atrial size was  normal.  7. Trivial pericardial effusion is present.  8. The mitral valve is normal in structure. Trivial mitral valve regurgitation.  9. The tricuspid valve is normal in structure. 10. The aortic valve is tricuspid. Aortic valve regurgitation is not visualized. No evidence of aortic valve sclerosis or stenosis. 11. The pulmonic valve was grossly normal. Pulmonic valve regurgitation is trivial. 12. Normal pulmonary artery systolic pressure. 13. The tricuspid regurgitant velocity is 1.91 m/s, and with an assumed right atrial pressure of 3 mmHg, the estimated right ventricular systolic pressure is normal at 17.5 mmHg. 14. The inferior vena cava is normal in size with greater than 50% respiratory variability, suggesting right atrial pressure of 3 mmHg. 15. No prior Echocardiogram.  FINDINGS  Left Ventricle: Left ventricular ejection fraction, by visual estimation, is 60 to 65%. The left ventricle has normal function. The average left ventricular global longitudinal strain is -21.3 %. The left ventricle has no regional wall motion abnormalities. The left ventricular internal cavity size was the left ventricle is normal in size. There is no left ventricular hypertrophy. Left ventricular diastolic parameters were normal. Normal left atrial pressure. Right Ventricle: The right ventricular size is normal. No increase in right ventricular wall thickness. Global RV systolic function is has normal systolic function. The tricuspid regurgitant velocity is 1.91 m/s, and with an assumed right atrial pressure  of 3 mmHg, the estimated right ventricular systolic pressure is normal at 17.5 mmHg. Left Atrium: Left atrial size was normal in size. Right Atrium: Right atrial size was normal in size Pericardium: Trivial pericardial effusion is present. Mitral Valve: The mitral valve is normal in structure. Trivial mitral valve regurgitation. Tricuspid Valve: The tricuspid valve is normal in structure. Tricuspid valve regurgitation is trivial. Aortic Valve: The aortic valve is tricuspid. Aortic valve regurgitation is not visualized. The aortic valve is structurally normal, with no evidence of sclerosis or stenosis. Pulmonic Valve: The pulmonic valve was grossly normal. Pulmonic valve regurgitation is trivial. Pulmonic regurgitation is trivial. Aorta: The aortic root and ascending aorta are structurally normal, with no evidence of dilitation. Venous: The right upper pulmonary vein is normal. The inferior vena cava is normal in size with greater than 50% respiratory variability, suggesting right atrial pressure of 3 mmHg. IAS/Shunts: No atrial level shunt detected by color flow Doppler.  LEFT VENTRICLE PLAX 2D LVIDd:         4.43 cm  Diastology LVIDs:         3.17 cm  LV e' lateral:   16.40 cm/s LV PW:         1.01  cm  LV E/e' lateral: 5.5 LV IVS:        0.95 cm  LV e' medial:    9.57 cm/s LVOT diam:     2.10 cm  LV E/e' medial:  9.5 LV SV:         49 ml LV SV Index:   22.66    2D Longitudinal Strain LVOT Area:     3.46 cm 2D Strain GLS Avg:     -21.3 %  RIGHT VENTRICLE RV Basal diam:  3.59 cm RV S prime:     18.80 cm/s TAPSE (M-mode): 2.2 cm RVSP:           17.5 mmHg LEFT ATRIUM             Index       RIGHT ATRIUM           Index LA diam:  3.50 cm 1.66 cm/m  RA Pressure: 3.00 mmHg LA Vol (A2C):   54.8 ml 25.98 ml/m RA Area:     13.30 cm LA Vol (A4C):   50.3 ml 23.85 ml/m RA Volume:   31.10 ml  14.74 ml/m LA Biplane Vol: 53.9 ml 25.55 ml/m  AORTIC VALVE LVOT Vmax:   88.70 cm/s LVOT Vmean:  62.900 cm/s LVOT VTI:    0.206 m  AORTA Ao Root diam: 2.80 cm MITRAL VALVE                        TRICUSPID VALVE MV Area (PHT):                      TR Peak grad:   14.5 mmHg MV PHT:                             TR Vmax:        191.00 cm/s MV Decel Time: 222 msec             Estimated RAP:  3.00 mmHg MV E velocity: 90.70 cm/s 103 cm/s  RVSP:           17.5 mmHg MV A velocity: 67.40 cm/s 70.3 cm/s MV E/A ratio:  1.35       1.5       SHUNTS                                     Systemic VTI:  0.21 m                                     Systemic Diam: 2.10 cm  Eleonore Chiquito MD Electronically signed by Eleonore Chiquito MD Signature Date/Time: 05/16/2019/4:44:07 PM    Final    [place] MFM OB COMP + 14 WK  Result Date: 05/04/2019 ----------------------------------------------------------------------  OBSTETRICS REPORT                       (Signed Final 05/04/2019 04:39 pm) ---------------------------------------------------------------------- Patient Info  ID #:       [id number]                          D.O.B.:  July 17, 1990 (28 yrs)  Name:       Damita Lack [place]                 Visit Date: 05/04/2019 02:59 pm ---------------------------------------------------------------------- Performed By  Performed By:     Novella Rob        Ref.  Address:     [address]  [place], [place]  Attending:        Johnell Comings MD         [facility]  Referred By:      Donnamae Jude                    MD ---------------------------------------------------------------------- Orders   #  Description                          Code         Ordered By   1  [place] MFM OB COMP + 14 WK               76805.01     TANYA PRATT  ----------------------------------------------------------------------   #  Order #                    Accession #                 Episode #   1  [id number]                  [id number]                  [id number]  ---------------------------------------------------------------------- Indications   Encounter for antenatal screening for          Z36.3   malformations   [redacted] weeks gestation of pregnancy                Z3A.19   Encounter for Strep B                          Z36.85  ---------------------------------------------------------------------- Vital Signs  Weight (lb): 205                               Height:        5'10"  BMI:         29.41 ---------------------------------------------------------------------- Fetal Evaluation  Num Of Fetuses:         1  Fetal Heart Rate(bpm):  148  Cardiac Activity:       Observed  Presentation:           Variable  Placenta:               Anterior  P. Cord Insertion:      Visualized, central  Amniotic Fluid  AFI FV:      Within normal limits  Largest Pocket(cm)                              4.37 ---------------------------------------------------------------------- Biometry  BPD:      43.8  mm     G. Age:  19w 2d         62  %    CI:        75.13   %    70 - 86                                                           FL/HC:      16.5   %    16.1 - 18.3  HC:      160.3  mm     G. Age:  18w 6d         35  %    HC/AC:      1.18        1.09 - 1.39  AC:      135.8  mm     G. Age:  19w 0d         63  %    FL/BPD:     60.5   %  FL:       26.5  mm     G. Age:  18w 0d         14  %    FL/AC:      19.5   %    20 - 24  CER:      18.5  mm     G. Age:  18w 2d         28  %  NFT:       3.7  mm  LV:        7.6  mm  CM:        3.2  mm  Est. FW:     249  gm      0 lb 9 oz     25  % ---------------------------------------------------------------------- OB History  Gravidity:    1 ---------------------------------------------------------------------- Gestational Age  LMP:           19w 0d        Date:  12/22/18                 EDD:   09/28/19  U/S Today:     18w 6d                                        EDD:   09/29/19  Best:          19w 0d     Det. By:  LMP  (12/22/18)          EDD:   09/28/19 ---------------------------------------------------------------------- Anatomy  Cranium:               Appears normal         Aortic Arch:            Appears normal  Cavum:                 Appears normal  Ductal Arch:            Appears normal  Ventricles:            Appears normal         Diaphragm:              Appears normal  Choroid Plexus:        Appears normal         Stomach:                Appears normal, left                                                                        sided  Cerebellum:            Appears normal         Abdomen:                Appears normal  Posterior Fossa:       Appears normal         Abdominal Wall:         Appears nml (cord                                                                        insert, abd wall)  Nuchal Fold:           Appears normal         Cord Vessels:           Appears normal (3                                                                        vessel cord)  Face:                  Appears normal         Kidneys:                Appear normal                          (orbits and profile)  Lips:                  Appears normal         Bladder:                Appears normal  Thoracic:              Appears normal         Spine:                  Appears normal  Heart:  Appears normal         Upper Extremities:      Appears normal                         (4CH, axis, and                         situs)  RVOT:                  Appears normal         Lower Extremities:      Appears normal  LVOT:                  Appears normal  Other:  Parents do not wish to know sex of fetus. Heels and 5th digit          visualized. Nasal bone visualized. Open hands visualized. ---------------------------------------------------------------------- Cervix Uterus Adnexa  Cervix  Length:           4.73  cm.  Normal appearance by transabdominal scan.  Uterus  No abnormality visualized. ---------------------------------------------------------------------- Comments  This patient was seen for a detailed fetal anatomy scan.  The fetal growth and amniotic fluid level were appropriate for  her gestational age.  There were no obvious fetal anomalies noted on today's  ultrasound exam.  Anomalies may be missed due to technical limitations. If the  fetus is in a suboptimal position or maternal habitus is  increased, visualization of the fetus in the maternal uterus  may be impaired.  Follow up as indicated. ----------------------------------------------------------------------                   Johnell Comings, MD Electronically Signed Final Report   05/04/2019 04:39 pm ----------------------------------------------------------------------   Assessment and Plan:  Pregnancy: G1P0000 at [redacted]w[redacted]d 1. Encounter for supervision of low-risk pregnancy in second trimester No concerns. Third trimester labs next visit. Preterm labor symptoms and general obstetric precautions including but not limited to vaginal bleeding, contractions, leaking of fluid and fetal movement were reviewed in detail with the  patient. I discussed the assessment and treatment plan with the patient. The patient was provided an opportunity to ask questions and all were answered. The patient agreed with the plan and demonstrated an understanding of the instructions. The patient was advised to call back or seek an in-person office evaluation/go to MAU at [facility] for any urgent or concerning symptoms. Please refer to After Visit Summary for other counseling recommendations.   I provided 10 minutes of face-to-face time during this encounter.  Return in about 4 weeks (around 06/29/2019) for 2 hr GTT, 3rd trimester labs, TDap, OFFICE OB Visit.  Future Appointments  Date Time Provider [place]  06/01/2019  4:15 PM Kalista Laguardia, Sallyanne Havers, MD [facility]  06/29/2019  8:45 AM Donnamae Jude, MD [facility]    Verita Schneiders, MD [facility] [place]

## 2019-06-29 ENCOUNTER — Ambulatory Visit (INDEPENDENT_AMBULATORY_CARE_PROVIDER_SITE_OTHER): Payer: BC Managed Care – PPO | Admitting: Family Medicine

## 2019-06-29 ENCOUNTER — Other Ambulatory Visit: Payer: Self-pay

## 2019-06-29 ENCOUNTER — Encounter: Payer: Self-pay | Admitting: Family Medicine

## 2019-06-29 VITALS — BP 138/82 | HR 103 | Wt 218.0 lb

## 2019-06-29 DIAGNOSIS — R8271 Bacteriuria: Secondary | ICD-10-CM

## 2019-06-29 DIAGNOSIS — Z3A27 27 weeks gestation of pregnancy: Secondary | ICD-10-CM

## 2019-06-29 DIAGNOSIS — Z3492 Encounter for supervision of normal pregnancy, unspecified, second trimester: Secondary | ICD-10-CM

## 2019-06-29 DIAGNOSIS — O280 Abnormal hematological finding on antenatal screening of mother: Secondary | ICD-10-CM

## 2019-06-29 DIAGNOSIS — Z23 Encounter for immunization: Secondary | ICD-10-CM

## 2019-06-29 LAB — CBC
Hematocrit: 36.8 % (ref 34.0–46.6)
Hemoglobin: 12.9 g/dL (ref 11.1–15.9)
MCH: 31.4 pg (ref 26.6–33.0)
MCHC: 35.1 g/dL (ref 31.5–35.7)
MCV: 90 fL (ref 79–97)
Platelets: 208 10*3/uL (ref 150–450)
RBC: 4.11 x10E6/uL (ref 3.77–5.28)
RDW: 11.6 % — ABNORMAL LOW (ref 11.7–15.4)
WBC: 8.7 10*3/uL (ref 3.4–10.8)

## 2019-06-29 NOTE — Progress Notes (Signed)
Pt is experiencing pain around her belly while walking quickly or during exercise

## 2019-06-29 NOTE — Patient Instructions (Signed)

## 2019-06-29 NOTE — Progress Notes (Signed)
   PRENATAL VISIT NOTE  Subjective:  Erin Whitney is a 29 y.o. G1P0000 at [redacted]w[redacted]d being seen today for ongoing prenatal care.  She is currently monitored for the following issues for this low-risk pregnancy and has Other and unspecified ovarian cyst; Asthma in adult; Supervision of low-risk pregnancy; Group B streptococcal bacteriuria; Obesity in pregnancy; and Chest pain on their problem list.  Patient reports umbilical pain.  Contractions: Not present. Vag. Bleeding: None.  Movement: Present. Denies leaking of fluid.   The following portions of the patient's history were reviewed and updated as appropriate: allergies, current medications, past family history, past medical history, past social history, past surgical history and problem list.   Objective:   Vitals:   06/29/19 0928  BP: 138/82  Pulse: (!) 103  Weight: 218 lb (98.9 kg)    Fetal Status: Fetal Heart Rate (bpm): 160 Fundal Height: 27 cm Movement: Present     General:  Alert, oriented and cooperative. Patient is in no acute distress.  Skin: Skin is warm and dry. No rash noted.   Cardiovascular: Normal heart rate noted  Respiratory: Normal respiratory effort, no problems with respiration noted  Abdomen: Soft, gravid, appropriate for gestational age.Small hernia at umbilicus reduced  Pain/Pressure: Present     Pelvic: Cervical exam deferred        Extremities: Normal range of motion.  Edema: Trace  Mental Status: Normal mood and affect. Normal behavior. Normal judgment and thought content.   Assessment and Plan:  Pregnancy: G1P0000 at [redacted]w[redacted]d 1. Encounter for supervision of low-risk pregnancy in second trimester 28 wk labs and TDaP today - CBC - Glucose Tolerance, 2 Hours w/1 Hour - RPR - HIV Antibody (routine testing w rflx)  2. Group B streptococcal bacteriuria Will need treatment in labor  Add mindfulness and tylenol pm if needed.  Preterm labor symptoms and general obstetric precautions including but not  limited to vaginal bleeding, contractions, leaking of fluid and fetal movement were reviewed in detail with the patient. Please refer to After Visit Summary for other counseling recommendations.   Return in 2 weeks (on 07/13/2019) for virtual.  Future Appointments  Date Time Provider [place]  07/11/2019  4:15 PM Aletha Halim, MD [facility]    Donnamae Jude, MD

## 2019-06-30 LAB — GLUCOSE TOLERANCE, 2 HOURS W/ 1HR
Glucose, 1 hour: 104 mg/dL (ref 65–179)
Glucose, 2 hour: 130 mg/dL (ref 65–152)
Glucose, Fasting: 80 mg/dL (ref 65–91)

## 2019-06-30 LAB — HIV ANTIBODY (ROUTINE TESTING W REFLEX): HIV Screen 4th Generation wRfx: NONREACTIVE

## 2019-06-30 LAB — RPR: RPR Ser Ql: NONREACTIVE

## 2019-07-11 ENCOUNTER — Telehealth (INDEPENDENT_AMBULATORY_CARE_PROVIDER_SITE_OTHER): Payer: BC Managed Care – PPO | Admitting: Obstetrics and Gynecology

## 2019-07-11 DIAGNOSIS — R8271 Bacteriuria: Secondary | ICD-10-CM

## 2019-07-11 DIAGNOSIS — O98813 Other maternal infectious and parasitic diseases complicating pregnancy, third trimester: Secondary | ICD-10-CM

## 2019-07-11 DIAGNOSIS — R0789 Other chest pain: Secondary | ICD-10-CM

## 2019-07-11 DIAGNOSIS — Z3492 Encounter for supervision of normal pregnancy, unspecified, second trimester: Secondary | ICD-10-CM

## 2019-07-11 DIAGNOSIS — Z3A28 28 weeks gestation of pregnancy: Secondary | ICD-10-CM

## 2019-07-11 NOTE — Progress Notes (Signed)
   TELEHEALTH VIRTUAL OBSTETRICS VISIT ENCOUNTER NOTE  Clinic: [facility]  I connected with Erin Whitney on 07/11/19 at  4:15 PM EST by telephone at home and verified that I am speaking with the correct person using two identifiers.   I discussed the limitations, risks, security and privacy concerns of performing an evaluation and management service by telephone and the availability of in person appointments. I also discussed with the patient that there may be a patient responsible charge related to this service. The patient expressed understanding and agreed to proceed.  Subjective:  Erin Whitney is a 29 y.o. G1P0000 at [redacted]w[redacted]d being followed for ongoing prenatal care.  She is currently monitored for the following issues for this low-risk pregnancy and has Other and unspecified ovarian cyst; Asthma in adult; Supervision of low-risk pregnancy; Group B streptococcal bacteriuria; Obesity in pregnancy; and Chest pain on their problem list.  Patient reports no complaints. Reports fetal movement. Denies any contractions, bleeding or leaking of fluid.   The following portions of the patient's history were reviewed and updated as appropriate: allergies, current medications, past family history, past medical history, past social history, past surgical history and problem list.   Objective:   Vitals:   07/11/19 1613  BP: 119/76    Babyscripts Data Reviewed: yes  General:  Alert, oriented and cooperative.   Mental Status: Normal mood and affect perceived. Normal judgment and thought content.  Rest of physical exam deferred due to type of encounter  Assessment and Plan:  Pregnancy: G1P0000 at [redacted]w[redacted]d 1. Encounter for supervision of low-risk pregnancy in second trimester Routine care. 28wk labs normal. 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 D/w her re: WHO recommendations to limit its use to high risk populations, potential exposure. I also d/w  her unknown risks associated with pregnancy  Preterm labor symptoms and general obstetric precautions including but not limited to vaginal bleeding, contractions, leaking of fluid and fetal movement were reviewed in detail with the patient.  I discussed the assessment and treatment plan with the patient. The patient was provided an opportunity to ask questions and all were answered. The patient agreed with the plan and demonstrated an understanding of the instructions. The patient was advised to call back or seek an in-person office evaluation/go to MAU at [facility] for any urgent or concerning symptoms. Please refer to After Visit Summary for other counseling recommendations.   I provided 10 minutes of non-face-to-face time during this encounter. The visit was conducted via MyChart-medicine  No follow-ups on file.  Future Appointments  Date Time Provider [place]  07/24/2019  4:15 PM Aletha Halim, MD [facility]    Aletha Halim, [place] for [facility], [place]

## 2019-07-17 ENCOUNTER — Telehealth: Payer: Self-pay | Admitting: *Deleted

## 2019-07-17 NOTE — Telephone Encounter (Signed)
babyscripts called alerting [place] about pt's BP 148/83 and then retake of 135/74 and abd pain. Called pt to follow up. Pt states the abd pain has been something shes had and has spoken to MD about it, it is lower abd pain and not upper abd pain. Pt denies any HA, SOB, swelling. Advised pt to only recheck BP if she is not feeling right and if she has another questionable one next week for her virtual appt we can have her come in office to recheck BP. Pt verbalizes and understands.

## 2019-07-24 ENCOUNTER — Telehealth (INDEPENDENT_AMBULATORY_CARE_PROVIDER_SITE_OTHER): Payer: BC Managed Care – PPO | Admitting: Obstetrics and Gynecology

## 2019-07-24 ENCOUNTER — Other Ambulatory Visit: Payer: Self-pay

## 2019-07-24 DIAGNOSIS — Z3A30 30 weeks gestation of pregnancy: Secondary | ICD-10-CM

## 2019-07-24 DIAGNOSIS — Z3492 Encounter for supervision of normal pregnancy, unspecified, second trimester: Secondary | ICD-10-CM

## 2019-07-24 NOTE — Progress Notes (Signed)
I connected with  Erin Whitney on 07/24/19 at  4:15 PM EST by telephone and verified that I am speaking with the correct person using two identifiers.   I discussed the limitations, risks, security and privacy concerns of performing an evaluation and management service by telephone and the availability of in person appointments. I also discussed with the patient that there may be a patient responsible charge related to this service. The patient expressed understanding and agreed to proceed.  Crosby Oyster, RN 07/24/2019  4:23 PM

## 2019-07-25 NOTE — Progress Notes (Signed)
   TELEHEALTH VIRTUAL OBSTETRICS VISIT ENCOUNTER NOTE  Clinic: [facility]  I connected with Erin Whitney on 07/24/19 at  4:15 PM EST by telephone at home and verified that I am speaking with the correct person using two identifiers.   I discussed the limitations, risks, security and privacy concerns of performing an evaluation and management service by telephone and the availability of in person appointments. I also discussed with the patient that there may be a patient responsible charge related to this service. The patient expressed understanding and agreed to proceed.  Subjective:  Erin Whitney is a 29 y.o. G1P0000 at [redacted]w[redacted]d being followed for ongoing prenatal care.  She is currently monitored for the following issues for this low-risk pregnancy and has Other and unspecified ovarian cyst; Asthma in adult; Supervision of low-risk pregnancy; Group B streptococcal bacteriuria; Obesity in pregnancy; and Chest pain on their problem list.  Patient reports no complaints. Reports fetal movement. Denies any contractions, bleeding or leaking of fluid.   The following portions of the patient's history were reviewed and updated as appropriate: allergies, current medications, past family history, past medical history, past social history, past surgical history and problem list.   Objective:   Vitals:   07/24/19 1621  BP: 131/85  Weight: 218 lb (98.9 kg)    Babyscripts Data Reviewed: yes  General:  Alert, oriented and cooperative.   Mental Status: Normal mood and affect perceived. Normal judgment and thought content.  Rest of physical exam deferred due to type of encounter  Assessment and Plan:  Pregnancy: G1P0000 at [redacted]w[redacted]d 1. Encounter for supervision of low-risk pregnancy in third trimester Routine care.   Preterm labor symptoms and general obstetric precautions including but not limited to vaginal bleeding, contractions, leaking of fluid and fetal movement were  reviewed in detail with the patient.  I discussed the assessment and treatment plan with the patient. The patient was provided an opportunity to ask questions and all were answered. The patient agreed with the plan and demonstrated an understanding of the instructions. The patient was advised to call back or seek an in-person office evaluation/go to MAU at [facility] for any urgent or concerning symptoms. Please refer to After Visit Summary for other counseling recommendations.   I provided 7 minutes of non-face-to-face time during this encounter. The visit was conducted via MyChart-medicine  No follow-ups on file.  Future Appointments  Date Time Provider [place]  08/07/2019  4:00 PM Caren Macadam, MD [facility]    Aletha Halim, [place] for [facility], [place]

## 2019-07-30 ENCOUNTER — Encounter: Payer: Self-pay | Admitting: Obstetrics and Gynecology

## 2019-07-30 DIAGNOSIS — O133 Gestational [pregnancy-induced] hypertension without significant proteinuria, third trimester: Secondary | ICD-10-CM | POA: Insufficient documentation

## 2019-07-30 DIAGNOSIS — Z8759 Personal history of other complications of pregnancy, childbirth and the puerperium: Secondary | ICD-10-CM

## 2019-07-30 HISTORY — DX: Personal history of other complications of pregnancy, childbirth and the puerperium: Z87.59

## 2019-07-30 HISTORY — DX: Gestational (pregnancy-induced) hypertension without significant proteinuria, third trimester: O13.3

## 2019-07-30 NOTE — Progress Notes (Signed)
OB Note I was called by babyscripts for BP 140 SBP. I talked to patient and repeat was 140 SBP and DBP slightly higher at 83; she took the BP b/c her hands and feet were looking more swollen. She denies any HA, visual changes.   I told her to repeat the BP at 2100 and if 140 or higher for SBP or 90 or higher for the bottom to come to [facility] triage for evaluation. I also told her if BP normal then okay to stay home, if no signs or symptoms of pre-eclampsia. inbakset message sent to have pt come to office tomorrow afternoon for in person BP check regardless of whether she comes in [place] for [employer] (Faculty Practice) 07/30/2019 Time: 2030

## 2019-07-31 ENCOUNTER — Ambulatory Visit (INDEPENDENT_AMBULATORY_CARE_PROVIDER_SITE_OTHER): Payer: BC Managed Care – PPO | Admitting: *Deleted

## 2019-07-31 ENCOUNTER — Other Ambulatory Visit: Payer: Self-pay

## 2019-07-31 DIAGNOSIS — R0789 Other chest pain: Secondary | ICD-10-CM

## 2019-07-31 DIAGNOSIS — R8271 Bacteriuria: Secondary | ICD-10-CM

## 2019-07-31 DIAGNOSIS — Z3492 Encounter for supervision of normal pregnancy, unspecified, second trimester: Secondary | ICD-10-CM

## 2019-07-31 NOTE — Progress Notes (Signed)
Pt here for a BP check after elevated reading on babyscripts yesterday.   BP in office today  146/82 139/84  Pt does complain of swelling in hands and feet, denies vision changes, RUQ pain.  Has had a HA that has been coming and going but has not had to treat it.   Informed Dr Ernestina Patches, will get lab work today and make next appointment in person to recheck BP.   Crosby Oyster, RN

## 2019-07-31 NOTE — Progress Notes (Signed)
Attestation of Attending Supervision of clinical support staff: I agree with the care provided to this patient and was available for any consultation.  I have reviewed the RN's note and chart. I was available for consult and to see the patient if needed. I reviewed the blood pressures and mildly elevated SBP.  Will get labs today and convert next visit to in person. Discussed s/sx of preeclampsia  Caren Macadam, MD, MPH, ABFM Attending [place] for [facility]

## 2019-08-01 LAB — COMPREHENSIVE METABOLIC PANEL
ALT: 31 IU/L (ref 0–32)
AST: 37 IU/L (ref 0–40)
Albumin/Globulin Ratio: 1.4 (ref 1.2–2.2)
Albumin: 3.9 g/dL (ref 3.9–5.0)
Alkaline Phosphatase: 111 IU/L (ref 39–117)
BUN/Creatinine Ratio: 9 (ref 9–23)
BUN: 7 mg/dL (ref 6–20)
Bilirubin Total: <0.2 mg/dL (ref 0.0–1.2)
CO2: 20 mmol/L (ref 20–29)
Calcium: 9 mg/dL (ref 8.7–10.2)
Chloride: 103 mmol/L (ref 96–106)
Creatinine, Ser: 0.78 mg/dL (ref 0.57–1.00)
GFR calc Af Amer: 120 mL/min/{1.73_m2} (ref >59)
GFR calc non Af Amer: 104 mL/min/{1.73_m2} (ref >59)
Globulin, Total: 2.8 g/dL (ref 1.5–4.5)
Glucose: 112 mg/dL — ABNORMAL HIGH (ref 65–99)
Potassium: 3.7 mmol/L (ref 3.5–5.2)
Sodium: 138 mmol/L (ref 134–144)
Total Protein: 6.7 g/dL (ref 6.0–8.5)

## 2019-08-01 LAB — CBC
Hematocrit: 36.1 % (ref 34.0–46.6)
Hemoglobin: 12.3 g/dL (ref 11.1–15.9)
MCH: 31.9 pg (ref 26.6–33.0)
MCHC: 34.1 g/dL (ref 31.5–35.7)
MCV: 94 fL (ref 79–97)
Platelets: 216 10*3/uL (ref 150–450)
RBC: 3.86 x10E6/uL (ref 3.77–5.28)
RDW: 11.8 % (ref 11.7–15.4)
WBC: 11.3 10*3/uL — ABNORMAL HIGH (ref 3.4–10.8)

## 2019-08-01 LAB — PROTEIN / CREATININE RATIO, URINE
Creatinine, Urine: 160 mg/dL
Protein, Ur: 21.6 mg/dL
Protein/Creat Ratio: 135 mg/g creat (ref 0–200)

## 2019-08-07 ENCOUNTER — Ambulatory Visit (INDEPENDENT_AMBULATORY_CARE_PROVIDER_SITE_OTHER): Payer: BC Managed Care – PPO | Admitting: Family Medicine

## 2019-08-07 ENCOUNTER — Other Ambulatory Visit: Payer: Self-pay

## 2019-08-07 VITALS — BP 136/57 | HR 111 | Wt 223.2 lb

## 2019-08-07 DIAGNOSIS — Z3493 Encounter for supervision of normal pregnancy, unspecified, third trimester: Secondary | ICD-10-CM

## 2019-08-07 DIAGNOSIS — E669 Obesity, unspecified: Secondary | ICD-10-CM

## 2019-08-07 DIAGNOSIS — Z3A32 32 weeks gestation of pregnancy: Secondary | ICD-10-CM

## 2019-08-07 DIAGNOSIS — O133 Gestational [pregnancy-induced] hypertension without significant proteinuria, third trimester: Secondary | ICD-10-CM

## 2019-08-07 DIAGNOSIS — O99213 Obesity complicating pregnancy, third trimester: Secondary | ICD-10-CM

## 2019-08-07 DIAGNOSIS — O99210 Obesity complicating pregnancy, unspecified trimester: Secondary | ICD-10-CM

## 2019-08-07 NOTE — Progress Notes (Signed)
   PRENATAL VISIT NOTE  Subjective:  Erin Whitney is a 29 y.o. G1P0000 at [redacted]w[redacted]d being seen today for ongoing prenatal care.  She is currently monitored for the following issues for this low-risk pregnancy and has Other and unspecified ovarian cyst; Asthma in adult; Supervision of low-risk pregnancy; Group B streptococcal bacteriuria; Obesity in pregnancy; Chest pain; and Transient hypertension of pregnancy on their problem list.  Patient reports no complaints.  Contractions: Not present. Vag. Bleeding: None.  Movement: Present. Denies leaking of fluid.   The following portions of the patient's history were reviewed and updated as appropriate: allergies, current medications, past family history, past medical history, past social history, past surgical history and problem list.   Objective:   Vitals:   08/07/19 1613 08/07/19 1643  BP: (!) 158/91 (!) 136/57  Pulse: (!) 120 (!) 111  Weight: 223 lb 3.2 oz (101.2 kg)     Fetal Status: Fetal Heart Rate (bpm): 155 Fundal Height: 32 cm Movement: Present     General:  Alert, oriented and cooperative. Patient is in no acute distress.  Skin: Skin is warm and dry. No rash noted.   Cardiovascular: Normal heart rate noted  Respiratory: Normal respiratory effort, no problems with respiration noted  Abdomen: Soft, gravid, appropriate for gestational age.  Pain/Pressure: Absent     Pelvic: Cervical exam deferred        Extremities: Normal range of motion.  Edema: None  Mental Status: Normal mood and affect. Normal behavior. Normal judgment and thought content.   Assessment and Plan:  Pregnancy: G1P0000 at [redacted]w[redacted]d  1. Transient hypertension of pregnancy in third trimester Repeat BP was 136/57 Reviewed that continued high values might mean she developed gestational HTN. Reviewed that this would mean an IOL at 37 wks. I let her know I was very reassured by repeat BP Reviewed s/sx of preeclampsia  2. Encounter for supervision of low-risk pregnancy  in third trimester Up to date Provided letter for work - leave starting 5/3. 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  Discussed labor precautions  3. Obesity in pregnancy TWG= 23 lb 3.2 oz (10.5 kg) which is above goal but not excessive at this point.   Preterm labor symptoms and general obstetric precautions including but not limited to vaginal bleeding, contractions, leaking of fluid and fetal movement were reviewed in detail with the patient. Please refer to After Visit Summary for other counseling recommendations.   No follow-ups on file.  Future Appointments  Date Time Provider [place]  08/21/2019  4:15 PM Anyanwu, Sallyanne Havers, MD [facility]    Caren Macadam, MD

## 2019-08-21 ENCOUNTER — Other Ambulatory Visit: Payer: Self-pay

## 2019-08-21 ENCOUNTER — Other Ambulatory Visit (HOSPITAL_COMMUNITY)
Admission: RE | Admit: 2019-08-21 | Discharge: 2019-08-21 | Disposition: A | Discharge status: Home / Self Care | Payer: BC Managed Care – PPO | Source: Ambulatory Visit | Attending: Obstetrics & Gynecology | Admitting: Obstetrics & Gynecology

## 2019-08-21 ENCOUNTER — Ambulatory Visit (INDEPENDENT_AMBULATORY_CARE_PROVIDER_SITE_OTHER): Payer: BC Managed Care – PPO | Admitting: Obstetrics & Gynecology

## 2019-08-21 VITALS — BP 130/84 | HR 125 | Wt 225.2 lb

## 2019-08-21 DIAGNOSIS — Z3493 Encounter for supervision of normal pregnancy, unspecified, third trimester: Secondary | ICD-10-CM

## 2019-08-21 DIAGNOSIS — O133 Gestational [pregnancy-induced] hypertension without significant proteinuria, third trimester: Secondary | ICD-10-CM

## 2019-08-21 NOTE — Progress Notes (Signed)
   PRENATAL VISIT NOTE  Subjective:  Erin Whitney is a 29 y.o. G1P0000 at [redacted]w[redacted]d being seen today for ongoing prenatal care.  She is currently monitored for the following issues for this low-risk pregnancy and has Other and unspecified ovarian cyst; Asthma in adult; Supervision of low-risk pregnancy; Group B streptococcal bacteriuria; Obesity in pregnancy; Chest pain; and Transient hypertension of pregnancy on their problem list.  Patient reports no complaints. Patient denies any headaches, visual symptoms, RUQ/epigastric pain or other concerning symptoms.  Contractions: Not present. Vag. Bleeding: None.  Movement: Present. Denies leaking of fluid.   The following portions of the patient's history were reviewed and updated as appropriate: allergies, current medications, past family history, past medical history, past social history, past surgical history and problem list.   Objective:   Vitals:   08/21/19 1616  BP: 130/84  Pulse: (!) 125  Weight: 225 lb 3.2 oz (102.2 kg)    Fetal Status: Fetal Heart Rate (bpm): 150 Fundal Height: 34 cm Movement: Present     General:  Alert, oriented and cooperative. Patient is in no acute distress.  Skin: Skin is warm and dry. No rash noted.   Cardiovascular: Normal heart rate noted  Respiratory: Normal respiratory effort, no problems with respiration noted  Abdomen: Soft, gravid, appropriate for gestational age.  Pain/Pressure: Absent     Pelvic: Cervical exam deferred        Extremities: Normal range of motion.  Edema: None  Mental Status: Normal mood and affect. Normal behavior. Normal judgment and thought content.   Assessment and Plan:  Pregnancy: G1P0000 at [redacted]w[redacted]d 1. Transient hypertension of pregnancy in third trimester Patient's initial BP at 10 weeks was 139/86.  Had some elevated BP last two visits with normal immediate rechecks. No PEC symptoms, normal labs.  Normal BP today, but reports 140/90 at home. Still no PEC symptoms.  Equivocal  for GHTN at this point. Needs persistent elevated BP to at least meet criteria for GHTN/PEC.  She will return for weekly BP checks, urine protein analysis; will not do BP at home unless having symptoms.  PEC precautions reviewed. Will check labs today, last checked on 07/31/19. Continue close monitoring. If she meets criteria for GHTN, will need antenatal testing, IOL at 37 weeks.  - CBC - Comprehensive metabolic panel - Protein / creatinine ratio, urine  2. Encounter for supervision of low-risk pregnancy in third trimester Already known to be GBS positive in urine. - GC/Chlamydia probe amp ([place])not at [facility] Preterm labor symptoms and general obstetric precautions including but not limited to vaginal bleeding, contractions, leaking of fluid and fetal movement were reviewed in detail with the patient. Please refer to After Visit Summary for other counseling recommendations.   Return in about 1 week (around 08/28/2019) for OFFICE OB Visit.  Future Appointments  Date Time Provider [place]  08/28/2019  4:15 PM Aletha Halim, MD [facility]  09/04/2019  4:15 PM [place] Coldren, Sallyanne Havers, MD [facility]  09/11/2019  4:15 PM Aletha Halim, MD [facility]  09/18/2019  4:15 PM Aletha Halim, MD [facility]  09/25/2019  4:15 PM Ebrima Ranta, Sallyanne Havers, MD [facility]    Verita Schneiders, MD

## 2019-08-21 NOTE — Patient Instructions (Signed)
Return to office for any scheduled appointments. Call the office or go to the MAU at [facility] if:  You begin to have strong, frequent contractions  Your water breaks.  Sometimes it is a big gush of fluid, sometimes it is just a trickle that keeps getting your panties wet or running down your legs  You have vaginal bleeding.  It is normal to have a small amount of spotting if your cervix was checked.   You do not feel your baby moving like normal.  If you do not, get something to eat and drink and lay down and focus on feeling your baby move.   If your baby is still not moving like normal, you should call the office or go to MAU.  Any other obstetric concerns.   Hypertension During Pregnancy High blood pressure (hypertension) is when the force of blood pumping through the arteries is too strong. Arteries are blood vessels that carry blood from the heart throughout the body. Hypertension during pregnancy can be mild or severe. Severe hypertension during pregnancy (preeclampsia) is a medical emergency that requires prompt evaluation and treatment. Different types of hypertension can happen during pregnancy. These include:  Chronic hypertension. This happens when you had high blood pressure before you became pregnant, and it continues during the pregnancy. Hypertension that develops before you are [redacted] weeks pregnant and continues during the pregnancy is also called chronic hypertension. If you have chronic hypertension, it will not go away after you have your baby. You will need follow-up visits with your health care provider after you have your baby. Your doctor may want you to keep taking medicine for your blood pressure.  Gestational hypertension. This is hypertension that develops after the 20th week of pregnancy. Gestational hypertension usually goes away after you have your baby, but your health care provider will need to monitor your blood pressure to make sure  that it is getting better.  Preeclampsia. This is severe hypertension during pregnancy. This can cause serious complications for you and your baby and can also cause complications for you after the delivery of your baby.  Postpartum preeclampsia. You may develop severe hypertension after giving birth. This usually occurs within 48 hours after childbirth but may occur up to 6 weeks after giving birth. This is rare. How does this affect me? Women who have hypertension during pregnancy have a greater chance of developing hypertension later in life or during future pregnancies. In some cases, hypertension during pregnancy can cause serious complications, such as:  Stroke.  Heart attack.  Injury to other organs, such as kidneys, lungs, or liver.  Preeclampsia.  Convulsions or seizures.  Placental abruption. How does this affect my baby? Hypertension during pregnancy can affect your baby. Your baby may:  Be born early (prematurely).  Not weigh as much as he or she should at birth (low birth weight).  Not tolerate labor well, leading to an unplanned cesarean delivery. What are the risks? There are certain factors that make it more likely for you to develop hypertension during pregnancy. These include:  Having hypertension during a previous pregnancy.  Being overweight.  Being age 35 or older.  Being pregnant for the first time.  Being pregnant with more than one baby.  Becoming pregnant using fertilization methods, such as IVF (in vitro fertilization).  Having other medical problems, such as diabetes, kidney disease, or lupus.  Having a family history of hypertension. What can I do to lower my risk?   The exact cause of hypertension during pregnancy is not known. You may be able to lower your risk by:  Maintaining a healthy weight.  Eating a healthy and balanced diet.  Following your health care provider's instructions about treating any long-term conditions that you had  before becoming pregnant. It is very important to keep all of your prenatal care appointments. Your health care provider will check your blood pressure and make sure that your pregnancy is progressing as expected. If a problem is found, early treatment can prevent complications. How is this treated? Treatment for hypertension during pregnancy varies depending on the type of hypertension you have and how serious it is.  If you were taking medicine for high blood pressure before you became pregnant, talk with your health care provider. You may need to change medicine during pregnancy because some medicines, like ACE inhibitors, may not be considered safe for your baby.  If you have gestational hypertension, your health care provider may order medicine to treat this during pregnancy.  If you are at risk for preeclampsia, your health care provider may recommend that you take a low-dose aspirin during your pregnancy.  If you have severe hypertension, you may need to be hospitalized so you and your baby can be monitored closely. You may also need to be given medicine to lower your blood pressure. This medicine may be given by mouth or through an IV.  In some cases, if your condition gets worse, you may need to deliver your baby early. Follow these instructions at home: Eating and drinking   Drink enough fluid to keep your urine pale yellow.  Avoid caffeine. Lifestyle  Do not use any products that contain nicotine or tobacco, such as cigarettes, e-cigarettes, and chewing tobacco. If you need help quitting, ask your health care provider.  Do not use alcohol or drugs.  Avoid stress as much as possible.  Rest and get plenty of sleep.  Regular exercise can help to reduce your blood pressure. Ask your health care provider what kinds of exercise are best for you. General instructions  Take over-the-counter and prescription medicines only as told by your health care provider.  Keep all prenatal  and follow-up visits as told by your health care provider. This is important. Contact a health care provider if:  You have symptoms that your health care provider told you may require more treatment or monitoring, such as: ? Headaches. ? Nausea or vomiting. ? Abdominal pain. ? Dizziness. ? Light-headedness. Get help right away if:  You have: ? Severe abdominal pain that does not get better with treatment. ? A severe headache that does not get better. ? Vomiting that does not get better. ? Sudden, rapid weight gain. ? Sudden swelling in your hands, ankles, or face. ? Vaginal bleeding. ? Blood in your urine. ? Blurred or double vision. ? Shortness of breath or chest pain. ? Weakness on one side of your body. ? Difficulty speaking.  Your baby is not moving as much as usual. Summary  High blood pressure (hypertension) is when the force of blood pumping through the arteries is too strong.  Hypertension during pregnancy can cause problems for you and your baby.  Treatment for hypertension during pregnancy varies depending on the type of hypertension you have and how serious it is.  Keep all prenatal and follow-up visits as told by your health care provider. This is important. This information is not intended to replace advice given to you by your health care provider.   Make sure you discuss any questions you have with your health care provider. Document Revised: 08/25/2018 Document Reviewed: 05/31/2018 Elsevier Patient Education  2020 [employer]  

## 2019-08-22 ENCOUNTER — Encounter: Payer: Self-pay | Admitting: Radiology

## 2019-08-22 LAB — CBC
Hematocrit: 37.1 % (ref 34.0–46.6)
Hemoglobin: 12.9 g/dL (ref 11.1–15.9)
MCH: 31.8 pg (ref 26.6–33.0)
MCHC: 34.8 g/dL (ref 31.5–35.7)
MCV: 91 fL (ref 79–97)
Platelets: 209 10*3/uL (ref 150–450)
RBC: 4.06 x10E6/uL (ref 3.77–5.28)
RDW: 11.8 % (ref 11.7–15.4)
WBC: 10.8 10*3/uL (ref 3.4–10.8)

## 2019-08-22 LAB — COMPREHENSIVE METABOLIC PANEL
ALT: 26 IU/L (ref 0–32)
AST: 34 IU/L (ref 0–40)
Albumin/Globulin Ratio: 1.4 (ref 1.2–2.2)
Albumin: 3.8 g/dL — ABNORMAL LOW (ref 3.9–5.0)
Alkaline Phosphatase: 131 IU/L — ABNORMAL HIGH (ref 39–117)
BUN/Creatinine Ratio: 10 (ref 9–23)
BUN: 8 mg/dL (ref 6–20)
Bilirubin Total: 0.3 mg/dL (ref 0.0–1.2)
CO2: 21 mmol/L (ref 20–29)
Calcium: 9.1 mg/dL (ref 8.7–10.2)
Chloride: 104 mmol/L (ref 96–106)
Creatinine, Ser: 0.8 mg/dL (ref 0.57–1.00)
GFR calc Af Amer: 116 mL/min/{1.73_m2} (ref >59)
GFR calc non Af Amer: 101 mL/min/{1.73_m2} (ref >59)
Globulin, Total: 2.7 g/dL (ref 1.5–4.5)
Glucose: 81 mg/dL (ref 65–99)
Potassium: 3.9 mmol/L (ref 3.5–5.2)
Sodium: 136 mmol/L (ref 134–144)
Total Protein: 6.5 g/dL (ref 6.0–8.5)

## 2019-08-22 LAB — PROTEIN / CREATININE RATIO, URINE
Creatinine, Urine: 215.1 mg/dL
Protein, Ur: 25.3 mg/dL
Protein/Creat Ratio: 118 mg/g creat (ref 0–200)

## 2019-08-23 LAB — GC/CHLAMYDIA PROBE AMP (~~LOC~~) NOT AT ARMC
Chlamydia: NEGATIVE
Comment: NEGATIVE
Comment: NORMAL
Neisseria Gonorrhea: NEGATIVE

## 2019-08-27 ENCOUNTER — Other Ambulatory Visit: Payer: Self-pay

## 2019-08-27 ENCOUNTER — Encounter (HOSPITAL_COMMUNITY): Payer: Self-pay | Admitting: Obstetrics & Gynecology

## 2019-08-27 ENCOUNTER — Inpatient Hospital Stay (HOSPITAL_COMMUNITY)
Admission: AD | Admit: 2019-08-27 | Discharge: 2019-08-27 | Disposition: A | Discharge status: Home / Self Care | Payer: BC Managed Care – PPO | Attending: Obstetrics & Gynecology | Admitting: Obstetrics & Gynecology

## 2019-08-27 DIAGNOSIS — Z3A35 35 weeks gestation of pregnancy: Secondary | ICD-10-CM | POA: Insufficient documentation

## 2019-08-27 DIAGNOSIS — R1011 Right upper quadrant pain: Secondary | ICD-10-CM | POA: Diagnosis present

## 2019-08-27 DIAGNOSIS — Z8249 Family history of ischemic heart disease and other diseases of the circulatory system: Secondary | ICD-10-CM | POA: Insufficient documentation

## 2019-08-27 DIAGNOSIS — Z3493 Encounter for supervision of normal pregnancy, unspecified, third trimester: Secondary | ICD-10-CM

## 2019-08-27 DIAGNOSIS — O133 Gestational [pregnancy-induced] hypertension without significant proteinuria, third trimester: Secondary | ICD-10-CM | POA: Insufficient documentation

## 2019-08-27 DIAGNOSIS — R8271 Bacteriuria: Secondary | ICD-10-CM

## 2019-08-27 DIAGNOSIS — O26893 Other specified pregnancy related conditions, third trimester: Secondary | ICD-10-CM

## 2019-08-27 DIAGNOSIS — Z3689 Encounter for other specified antenatal screening: Secondary | ICD-10-CM | POA: Insufficient documentation

## 2019-08-27 DIAGNOSIS — R0789 Other chest pain: Secondary | ICD-10-CM

## 2019-08-27 DIAGNOSIS — Z833 Family history of diabetes mellitus: Secondary | ICD-10-CM | POA: Insufficient documentation

## 2019-08-27 DIAGNOSIS — R109 Unspecified abdominal pain: Secondary | ICD-10-CM

## 2019-08-27 DIAGNOSIS — Z8052 Family history of malignant neoplasm of bladder: Secondary | ICD-10-CM | POA: Insufficient documentation

## 2019-08-27 LAB — COMPREHENSIVE METABOLIC PANEL
ALT: 26 U/L (ref 0–44)
AST: 33 U/L (ref 15–41)
Albumin: 2.8 g/dL — ABNORMAL LOW (ref 3.5–5.0)
Alkaline Phosphatase: 109 U/L (ref 38–126)
Anion gap: 10 (ref 5–15)
BUN: 8 mg/dL (ref 6–20)
CO2: 18 mmol/L — ABNORMAL LOW (ref 22–32)
Calcium: 8.4 mg/dL — ABNORMAL LOW (ref 8.9–10.3)
Chloride: 103 mmol/L (ref 98–111)
Creatinine, Ser: 0.72 mg/dL (ref 0.44–1.00)
GFR calc Af Amer: >60 mL/min (ref >60)
GFR calc non Af Amer: >60 mL/min (ref >60)
Glucose, Bld: 85 mg/dL (ref 70–99)
Potassium: 3.5 mmol/L (ref 3.5–5.1)
Sodium: 131 mmol/L — ABNORMAL LOW (ref 135–145)
Total Bilirubin: 0.6 mg/dL (ref 0.3–1.2)
Total Protein: 6 g/dL — ABNORMAL LOW (ref 6.5–8.1)

## 2019-08-27 LAB — CBC
HCT: 35.8 % — ABNORMAL LOW (ref 36.0–46.0)
Hemoglobin: 12.3 g/dL (ref 12.0–15.0)
MCH: 32.3 pg (ref 26.0–34.0)
MCHC: 34.4 g/dL (ref 30.0–36.0)
MCV: 94 fL (ref 80.0–100.0)
Platelets: 192 10*3/uL (ref 150–400)
RBC: 3.81 MIL/uL — ABNORMAL LOW (ref 3.87–5.11)
RDW: 12.3 % (ref 11.5–15.5)
WBC: 11 10*3/uL — ABNORMAL HIGH (ref 4.0–10.5)
nRBC: 0 % (ref 0.0–0.2)

## 2019-08-27 LAB — URINALYSIS, ROUTINE W REFLEX MICROSCOPIC
Bilirubin Urine: NEGATIVE
Glucose, UA: NEGATIVE mg/dL
Hgb urine dipstick: NEGATIVE
Ketones, ur: NEGATIVE mg/dL
Leukocytes,Ua: NEGATIVE
Nitrite: NEGATIVE
Protein, ur: NEGATIVE mg/dL
Specific Gravity, Urine: 1.003 — ABNORMAL LOW (ref 1.005–1.030)
pH: 7 (ref 5.0–8.0)

## 2019-08-27 LAB — PROTEIN / CREATININE RATIO, URINE
Creatinine, Urine: 26.46 mg/dL
Total Protein, Urine: <6 mg/dL

## 2019-08-27 NOTE — MAU Provider Note (Addendum)
Patient Erin Whitney is a 29 y.o. G1P0000  at [redacted]w[redacted]d here with complaints of RUQ pain. She denies HA, blurry vision, vaginal bleeding, LOF, contractions. She denies fever, SOB, difficulty breathing, dysuria, NV. She reports that she felt some pain in her right side under her ribs so she checked her blood pressure at home and it was elevated. She logged the pressures into [facility] and then was notified by RN to come to MAU for evaluation.   She reports that her blood pressures have been elevated her entire pregnancy and that she is getting weekly labs at her OB offices.  She was aware that she may need to be induced at 37 weeks if her blood pressures continue to be elevated.  History     CSN: [id number]  Arrival date and time: 08/27/19 1849   None     Chief Complaint  Patient presents with  . Abdominal Pain  . Hypertension   Abdominal Pain This is a new problem. The current episode started today. The pain is located in the RUQ. The pain is at a severity of 3/10. The quality of the pain is dull. The abdominal pain does not radiate. Pertinent negatives include no constipation, diarrhea, dysuria, headaches, nausea or vomiting. Exacerbated by: sitting up. Relieved by: reclining.  Hypertension This is a recurrent problem. The current episode started 1 to 4 weeks ago. The problem has been gradually worsening since onset. Pertinent negatives include no blurred vision, chest pain, headaches, shortness of breath or sweats.    OB History    Gravida  1   Para  0   Term  0   Preterm  0   AB  0   Living  0     SAB  0   TAB  0   Ectopic  0   Multiple  0   Live Births              Past Medical History:  Diagnosis Date  . Asthma   . Ovarian cyst     Past Surgical History:  Procedure Laterality Date  . WISDOM TOOTH EXTRACTION      Family History  Problem Relation Age of Onset  . Diabetes Mother   . Heart disease Mother   . Cancer Mother        bladder  . Diabetes  Father   . Cancer Paternal Grandfather        melonoma  . Diabetes Maternal Grandmother   . Cancer Maternal Grandmother        cervical    Social History   Tobacco Use  . Smoking status: Never Smoker  . Smokeless tobacco: Never Used  Substance Use Topics  . Alcohol use: Yes    Alcohol/week: 1.0 standard drinks    Types: 1 Cans of beer per week    Comment: social  . Drug use: No    Allergies: No Known Allergies  Medications Prior to Admission  Medication Sig Dispense Refill Last Dose  . albuterol (VENTOLIN HFA) 108 (90 Base) MCG/ACT inhaler Inhale 2 puffs into the lungs every 6 (six) hours as needed for wheezing or shortness of breath. 18 g 1   . Prenatal Vit-Fe Fumarate-FA (MULTIVITAMIN-PRENATAL) 27-0.8 MG TABS tablet Take 1 tablet by mouth daily at 12 noon.       Review of Systems  Constitutional: Negative.   HENT: Negative.   Eyes: Negative for blurred vision.  Respiratory: Negative.  Negative for shortness of breath.   Cardiovascular:  Negative.  Negative for chest pain.  Gastrointestinal: Positive for abdominal pain. Negative for constipation, diarrhea, nausea and vomiting.  Genitourinary: Negative.  Negative for dysuria.  Musculoskeletal: Negative.   Neurological: Negative.  Negative for headaches.   Physical Exam   Blood pressure (!) 150/86, pulse (!) 105, temperature 98.2 F (36.8 C), temperature source Oral, resp. rate 17, height 5\' 10"  (1.778 m), weight 102.5 kg, last menstrual period 12/22/2018, SpO2 99 %.  Physical Exam  Constitutional: She appears well-developed.  HENT:  Head: Normocephalic.  Respiratory: Effort normal.  GI: Soft. She exhibits no distension and no mass. There is no abdominal tenderness. There is no rebound and no guarding.  RUQ is not tender on palpation  Musculoskeletal:        General: Normal range of motion.     Cervical back: Normal range of motion.  Neurological: She is alert.  Skin: Skin is warm and dry.    Results for  orders placed or performed during the hospital encounter of 08/27/19 (from the past 24 hour(s))  Urinalysis, Routine w reflex microscopic     Status: Abnormal   Collection Time: 08/27/19  7:25 PM  Result Value Ref Range   Color, Urine STRAW (A) YELLOW   APPearance CLEAR CLEAR   Specific Gravity, Urine 1.003 (L) 1.005 - 1.030   pH 7.0 5.0 - 8.0   Glucose, UA NEGATIVE NEGATIVE mg/dL   Hgb urine dipstick NEGATIVE NEGATIVE   Bilirubin Urine NEGATIVE NEGATIVE   Ketones, ur NEGATIVE NEGATIVE mg/dL   Protein, ur NEGATIVE NEGATIVE mg/dL   Nitrite NEGATIVE NEGATIVE   Leukocytes,Ua NEGATIVE NEGATIVE  Protein / creatinine ratio, urine     Status: None   Collection Time: 08/27/19  7:25 PM  Result Value Ref Range   Creatinine, Urine 26.46 mg/dL   Total Protein, Urine <6 mg/dL   Protein Creatinine Ratio        0.00 - 0.15 mg/mg[Cre]  CBC     Status: Abnormal   Collection Time: 08/27/19  7:41 PM  Result Value Ref Range   WBC 11.0 (H) 4.0 - 10.5 K/uL   RBC 3.81 (L) 3.87 - 5.11 MIL/uL   Hemoglobin 12.3 12.0 - 15.0 g/dL   HCT 35.8 (L) 36.0 - 46.0 %   MCV 94.0 80.0 - 100.0 fL   MCH 32.3 26.0 - 34.0 pg   MCHC 34.4 30.0 - 36.0 g/dL   RDW 12.3 11.5 - 15.5 %   Platelets 192 150 - 400 K/uL   nRBC 0.0 0.0 - 0.2 %  Comprehensive metabolic panel     Status: Abnormal   Collection Time: 08/27/19  7:41 PM  Result Value Ref Range   Sodium 131 (L) 135 - 145 mmol/L   Potassium 3.5 3.5 - 5.1 mmol/L   Chloride 103 98 - 111 mmol/L   CO2 18 (L) 22 - 32 mmol/L   Glucose, Bld 85 70 - 99 mg/dL   BUN 8 6 - 20 mg/dL   Creatinine, Ser 0.72 0.44 - 1.00 mg/dL   Calcium 8.4 (L) 8.9 - 10.3 mg/dL   Total Protein 6.0 (L) 6.5 - 8.1 g/dL   Albumin 2.8 (L) 3.5 - 5.0 g/dL   AST 33 15 - 41 U/L   ALT 26 0 - 44 U/L   Alkaline Phosphatase 109 38 - 126 U/L   Total Bilirubin 0.6 0.3 - 1.2 mg/dL   GFR calc non Af Amer >60 >60 mL/min   GFR calc Af Amer >60 >60 mL/min  Anion gap 10 5 - 15    MAU Course   Procedures  MDM -will draw CBC, CMP, PCR -NST: 140 bpm, mod var, present acel, no decels, no contractions.  -Dr. Harolyn Rutherford aware of patient's status in MAU. Dr. Harolyn Rutherford has notified [facility] of patient's need for BPP and growth at MFM this week, as well as canceled patient's OB visit tomorrow in the office since patient is being seen in MAU and labs are being drawn.  -2048: PCR still pending. Patient care endorsed to [facility].  [place] reviewed. No evidence of PEC. Plan for f/u US and IOL at 37 wks, discussed with pt and spouse. Stable for discharge home.   Assessment and Plan  [redacted] weeks gestation Reactive NST Gestational HTN Discharge home Follow up at [facility] as scheduled Follow up with MFM for [place] and BPP PEC precautions  Allergies as of 08/27/2019   No Known Allergies     Medication List    TAKE these medications   albuterol 108 (90 Base) MCG/ACT inhaler Commonly known as: VENTOLIN HFA Inhale 2 puffs into the lungs every 6 (six) hours as needed for wheezing or shortness of breath.   multivitamin-prenatal 27-0.8 MG Tabs tablet Take 1 tablet by mouth daily at 12 noon.      Julianne Handler, CNM  08/27/2019 9:16 PM

## 2019-08-27 NOTE — Discharge Instructions (Signed)

## 2019-08-27 NOTE — MAU Note (Signed)
Erin Whitney is a 29 y.o. at [redacted]w[redacted]d here in MAU reporting: was having RUQ pain this afternoon so she checked her BP at home and it was 144/87 and then 144/83 and then 137/87. States pain is less sharp then it was but it is still there. No headache or visual changes. +FM  Onset of complaint: today  Pain score: 4/10  Vitals:   08/27/19 1904  BP: (!) 142/77  Pulse: 100  Resp: 17  Temp: 98.2 F (36.8 C)  SpO2: 99%     FHT: +FM  Lab orders placed from triage: UA

## 2019-08-28 ENCOUNTER — Encounter: Payer: BC Managed Care – PPO | Admitting: Obstetrics and Gynecology

## 2019-09-01 ENCOUNTER — Ambulatory Visit (HOSPITAL_COMMUNITY)
Admission: RE | Admit: 2019-09-01 | Discharge: 2019-09-01 | Disposition: A | Discharge status: Home / Self Care | Payer: BC Managed Care – PPO | Source: Ambulatory Visit | Attending: Obstetrics & Gynecology | Admitting: Obstetrics & Gynecology

## 2019-09-01 ENCOUNTER — Ambulatory Visit (HOSPITAL_COMMUNITY): Payer: BC Managed Care – PPO | Admitting: *Deleted

## 2019-09-01 ENCOUNTER — Encounter (HOSPITAL_COMMUNITY): Payer: Self-pay

## 2019-09-01 ENCOUNTER — Other Ambulatory Visit: Payer: Self-pay

## 2019-09-01 DIAGNOSIS — R8271 Bacteriuria: Secondary | ICD-10-CM | POA: Insufficient documentation

## 2019-09-01 DIAGNOSIS — R0789 Other chest pain: Secondary | ICD-10-CM | POA: Insufficient documentation

## 2019-09-01 DIAGNOSIS — Z3A36 36 weeks gestation of pregnancy: Secondary | ICD-10-CM

## 2019-09-01 DIAGNOSIS — O133 Gestational [pregnancy-induced] hypertension without significant proteinuria, third trimester: Secondary | ICD-10-CM

## 2019-09-01 DIAGNOSIS — O0993 Supervision of high risk pregnancy, unspecified, third trimester: Secondary | ICD-10-CM | POA: Insufficient documentation

## 2019-09-01 DIAGNOSIS — Z362 Encounter for other antenatal screening follow-up: Secondary | ICD-10-CM

## 2019-09-04 ENCOUNTER — Encounter: Payer: Self-pay | Admitting: Radiology

## 2019-09-04 ENCOUNTER — Other Ambulatory Visit: Payer: Self-pay

## 2019-09-04 ENCOUNTER — Ambulatory Visit (INDEPENDENT_AMBULATORY_CARE_PROVIDER_SITE_OTHER): Payer: BC Managed Care – PPO | Admitting: Obstetrics & Gynecology

## 2019-09-04 VITALS — BP 140/84 | HR 94 | Wt 226.0 lb

## 2019-09-04 DIAGNOSIS — Z3A36 36 weeks gestation of pregnancy: Secondary | ICD-10-CM | POA: Diagnosis not present

## 2019-09-04 DIAGNOSIS — R8271 Bacteriuria: Secondary | ICD-10-CM

## 2019-09-04 DIAGNOSIS — O133 Gestational [pregnancy-induced] hypertension without significant proteinuria, third trimester: Secondary | ICD-10-CM

## 2019-09-04 DIAGNOSIS — O99820 Streptococcus B carrier state complicating pregnancy: Secondary | ICD-10-CM

## 2019-09-04 DIAGNOSIS — O0993 Supervision of high risk pregnancy, unspecified, third trimester: Secondary | ICD-10-CM

## 2019-09-04 NOTE — Patient Instructions (Signed)
Return to office for any scheduled appointments. Call the office or go to the MAU at [place] at [facility] if:  You begin to have strong, frequent contractions  Your water breaks.  Sometimes it is a big gush of fluid, sometimes it is just a trickle that keeps getting your panties wet or running down your legs  You have vaginal bleeding.  It is normal to have a small amount of spotting if your cervix was checked.   You do not feel your baby moving like normal.  If you do not, get something to eat and drink and lay down and focus on feeling your baby move.   If your baby is still not moving like normal, you should call the office or go to MAU.  Any other obstetric concerns.   Labor Induction  Labor induction is when steps are taken to cause a pregnant woman to begin the labor process. Most women go into labor on their own between 37 weeks and 42 weeks of pregnancy. When this does not happen or when there is a medical need for labor to begin, steps may be taken to induce labor. Labor induction causes a pregnant woman's uterus to contract. It also causes the cervix to soften (ripen), open (dilate), and thin out (efface). Usually, labor is not induced before 39 weeks of pregnancy unless there is a medical reason to do so. Your health care provider will determine if labor induction is needed. Before inducing labor, your health care provider will consider a number of factors, including:  Your medical condition and your baby's.  How many weeks along you are in your pregnancy.  How mature your baby's lungs are.  The condition of your cervix.  The position of your baby.  The size of your birth canal. What are some reasons for labor induction? Labor may be induced if:  Your health or your baby's health is at risk.  Your pregnancy is overdue by 1 week or more.  Your water breaks but labor does not start on its own.  There is a low amount of amniotic fluid around your  baby. You may also choose (elect) to have labor induced at a certain time. Generally, elective labor induction is done no earlier than 39 weeks of pregnancy. What methods are used for labor induction? Methods used for labor induction include:  Prostaglandin medicine. This medicine starts contractions and causes the cervix to dilate and ripen. It can be taken by mouth (orally) or by being inserted into the vagina (suppository).  Inserting a small, thin tube (catheter) with a balloon into the vagina and then expanding the balloon with water to dilate the cervix.  Stripping the membranes. In this method, your health care provider gently separates amniotic sac tissue from the cervix. This causes the cervix to stretch, which in turn causes the release of a hormone called progesterone. The hormone causes the uterus to contract. This procedure is often done during an office visit, after which you will be sent home to wait for contractions to begin.  Breaking the water. In this method, your health care provider uses a small instrument to make a small hole in the amniotic sac. This eventually causes the amniotic sac to break. Contractions should begin after a few hours.  Medicine to trigger or strengthen contractions. This medicine is given through an IV that is inserted into a vein in your arm. Except for membrane stripping, which can be done in a clinic, labor  induction is done in the hospital so that you and your baby can be carefully monitored. How long does it take for labor to be induced? The length of time it takes to induce labor depends on how ready your body is for labor. Some inductions can take up to 2-3 days, while others may take less than a day. Induction may take longer if:  You are induced early in your pregnancy.  It is your first pregnancy.  Your cervix is not ready. What are some risks associated with labor induction? Some risks associated with labor induction include:  Changes  in fetal heart rate, such as being too high, too low, or irregular (erratic).  Failed induction.  Infection in the mother or the baby.  Increased risk of having a cesarean delivery.  Fetal death.  Breaking off (abruption) of the placenta from the uterus (rare).  Rupture of the uterus (very rare). When induction is needed for medical reasons, the benefits of induction generally outweigh the risks. What are some reasons for not inducing labor? Labor induction should not be done if:  Your baby does not tolerate contractions.  You have had previous surgeries on your uterus, such as a myomectomy, removal of fibroids, or a vertical scar from a previous cesarean delivery.  Your placenta lies very low in your uterus and blocks the opening of the cervix (placenta previa).  Your baby is not in a head-down position.  The umbilical cord drops down into the birth canal in front of the baby.  There are unusual circumstances, such as the baby being very early (premature).  You have had more than 2 previous cesarean deliveries. Summary  Labor induction is when steps are taken to cause a pregnant woman to begin the labor process.  Labor induction causes a pregnant woman's uterus to contract. It also causes the cervix to ripen, dilate, and efface.  Labor is not induced before 39 weeks of pregnancy unless there is a medical reason to do so.  When induction is needed for medical reasons, the benefits of induction generally outweigh the risks. This information is not intended to replace advice given to you by your health care provider. Make sure you discuss any questions you have with your health care provider. Document Revised: 05/07/2017 Document Reviewed: 06/17/2016 Elsevier Patient Education  2020 [employer].

## 2019-09-04 NOTE — Progress Notes (Signed)
PRENATAL VISIT NOTE  Subjective:  Erin Whitney is a 29 y.o. G1P0000 at [redacted]w[redacted]d being seen today for ongoing prenatal care.  She is currently monitored for the following issues for this high-risk pregnancy and has Other and unspecified ovarian cyst; Asthma in adult; Supervision of high-risk pregnancy; Group B streptococcal bacteriuria; Obesity in pregnancy; Chest pain; and Gestational hypertension, third trimester on their problem list.  Patient reports no complaints. Patient denies any headaches, visual symptoms, RUQ/epigastric pain or other concerning symptoms.  Contractions: Not present. Vag. Bleeding: None.  Movement: Present. Denies leaking of fluid.   The following portions of the patient's history were reviewed and updated as appropriate: allergies, current medications, past family history, past medical history, past social history, past surgical history and problem list.   Objective:   Vitals:   09/04/19 1620  BP: 140/84  Pulse: 94  Weight: 226 lb (102.5 kg)    Fetal Status: Fetal Heart Rate (bpm): NST   Movement: Present     General:  Alert, oriented and cooperative. Patient is in no acute distress.  Skin: Skin is warm and dry. No rash noted.   Cardiovascular: Normal heart rate noted  Respiratory: Normal respiratory effort, no problems with respiration noted  Abdomen: Soft, gravid, appropriate for gestational age.  Pain/Pressure: Present     Pelvic: Cervical exam deferred        Extremities: Normal range of motion.  Edema: Trace  Mental Status: Normal mood and affect. Normal behavior. Normal judgment and thought content.   Imaging: [place] MFM FETAL BPP WO NON STRESS  Result Date: 09/01/2019 ----------------------------------------------------------------------  OBSTETRICS REPORT                       (Signed Final 09/01/2019 04:13 pm) ---------------------------------------------------------------------- Patient Info  ID #:       [id number]                          D.O.B.:   Jun 23, 1990 (28 yrs)  Name:       Erin Whitney                 Visit Date: 09/01/2019 03:41 pm ---------------------------------------------------------------------- Performed By  Performed By:     Novella Rob        Ref. Address:     [address]                    [place], [place]  [place]  Attending:        Johnell Comings MD         Location[facility]  Referred By:      Donnamae Jude                    MD ---------------------------------------------------------------------- Orders   #  Description                          Code         Ordered By   1  [place] MFM OB FOLLOW UP                  76816.01     Verita Schneiders   2  [place] MFM FETAL BPP WO NON              76819.01     Bengie Kaucher      STRESS  ----------------------------------------------------------------------   #  Order #                    Accession #                 Episode #   1  [id number]                  [id number]                  [id number]   2  [id number]                  [id number]                  [id number]  ---------------------------------------------------------------------- Indications   Gestational hypertension without significant   O13.3   proteinuria, third trimester   Encounter for other antenatal screening        Z36.2   follow-up   [redacted] weeks gestation of pregnancy                Z3A.36  ---------------------------------------------------------------------- Vital Signs                                                 Height:        5'10" ---------------------------------------------------------------------- Fetal Evaluation  Num Of Fetuses:         1  Fetal Heart Rate(bpm):  142  Cardiac Activity:       Observed  Presentation:           Cephalic  Placenta:                Anterior  Amniotic Fluid  AFI FV:      Within normal limits ---------------------------------------------------------------------- Biophysical Evaluation  Amniotic F.V:   Within normal limits       F. Tone:        Observed  F. Movement:    Observed  Score:          8/8  F. Breathing:   Observed ---------------------------------------------------------------------- Biometry  BPD:      93.5  mm     G. Age:  38w 0d         95  %    CI:        73.51   %    70 - 86                                                          FL/HC:      20.0   %    20.1 - 22.1  HC:      346.5  mm     G. Age:  40w 1d         96  %    HC/AC:      1.03        0.93 - 1.11  AC:      336.4  mm     G. Age:  37w 4d         91  %    FL/BPD:     74.0   %    71 - 87  FL:       69.2  mm     G. Age:  35w 4d         29  %    FL/AC:      20.6   %    20 - 24  LV:        4.4  mm  Est. FW:    3199  gm      7 lb 1 oz     84  % ---------------------------------------------------------------------- OB History  Gravidity:    1 ---------------------------------------------------------------------- Gestational Age  LMP:           36w 1d        Date:  12/22/18                 EDD:   09/28/19  U/S Today:     37w 6d                                        EDD:   09/16/19  Best:          36w 1d     Det. By:  LMP  (12/22/18)          EDD:   09/28/19 ---------------------------------------------------------------------- Anatomy  Cranium:               Appears normal         Aortic Arch:            Previously seen  Cavum:                 Appears normal         Ductal Arch:            Previously seen  Ventricles:            Appears normal         Diaphragm:              Appears normal  Choroid Plexus:  Previously seen        Stomach:                Appears normal, left                                                                        sided  Cerebellum:            Previously seen        Abdomen:                Appears normal  Posterior Fossa:        Previously seen        Abdominal Wall:         Previously seen  Nuchal Fold:           Previously seen        Cord Vessels:           Previously seen  Face:                  Orbits and profile     Kidneys:                Appear normal                         previously seen  Lips:                  Previously seen        Bladder:                Appears normal  Thoracic:              Appears normal         Spine:                  Previously seen  Heart:                 Previously seen        Upper Extremities:      Previously seen  RVOT:                  Previously seen        Lower Extremities:      Previously seen  LVOT:                  Previously seen  Other:  Parents do not wish to know sex of fetus. Heels and 5th digit          visualized. Nasal bone visualized. Open hands visualized. ---------------------------------------------------------------------- Cervix Uterus Adnexa  Cervix  Not visualized (advanced GA >24wks) ---------------------------------------------------------------------- Comments  This patient was seen for a follow up growth scan due to  recently diagnosed gestational hypertension that is not  currently treated with any medications.  She denies any other  problems in her current pregnancy.  She was informed that the fetal growth and amniotic fluid  level appears appropriate for her gestational age.  A biophysical profile performed today was 8 out of 8.  The patient reports that due to gestational hypertension, she  is scheduled for delivery sometime next week.  No further exams were scheduled  in our office. ----------------------------------------------------------------------                   Johnell Comings, MD Electronically Signed Final Report   09/01/2019 04:13 pm ----------------------------------------------------------------------  [place] MFM OB FOLLOW UP  Result Date: 09/01/2019 ----------------------------------------------------------------------  OBSTETRICS REPORT                        (Signed Final 09/01/2019 04:13 pm) ---------------------------------------------------------------------- Patient Info  ID #:       [id number]                          D.O.B.:  07-Mar-1991 (28 yrs)  Name:       KEVEN SHIBLEY Lybeck                 Visit Date: 09/01/2019 03:41 pm ---------------------------------------------------------------------- Performed By  Performed By:     Novella Rob        Ref. Address:     [address]                    [place], [place]  Attending:        Johnell Comings MD         Location[facility]  Referred By:      Donnamae Jude                    MD ---------------------------------------------------------------------- Orders   #  Description                          Code         Ordered By   1  [place] MFM OB FOLLOW UP                  [id number]     Verita Schneiders   2  [place] MFM FETAL BPP WO NON  [id number]     [facility]      STRESS  ----------------------------------------------------------------------   #  Order #                    Accession #                 Episode #   1  [id number]                  [id number]                  [id number]   2  [id number]                  [id number]                  [id number]  ---------------------------------------------------------------------- Indications   Gestational hypertension without significant   O13.3   proteinuria, third trimester   Encounter for other antenatal screening        Z36.2   follow-up   [redacted] weeks gestation of pregnancy                Z3A.36  ---------------------------------------------------------------------- Vital Signs                                                 Height:        5'10"  ---------------------------------------------------------------------- Fetal Evaluation  Num Of Fetuses:         1  Fetal Heart Rate(bpm):  142  Cardiac Activity:       Observed  Presentation:           Cephalic  Placenta:               Anterior  Amniotic Fluid  AFI FV:      Within normal limits ---------------------------------------------------------------------- Biophysical Evaluation  Amniotic F.V:   Within normal limits       F. Tone:        Observed  F. Movement:    Observed                   Score:          8/8  F. Breathing:   Observed ---------------------------------------------------------------------- Biometry  BPD:      93.5  mm     G. Age:  38w 0d         95  %    CI:        73.51   %    70 - 86                                                          FL/HC:      20.0   %    20.1 - 22.1  HC:      346.5  mm     G. Age:  40w 1d         96  %    HC/AC:      1.03        0.93 - 1.11  AC:      336.4  mm     G. Age:  37w 4d         91  %    FL/BPD:     74.0   %    71 - 87  FL:       69.2  mm     G. Age:  35w 4d         29  %    FL/AC:      20.6   %    20 - 24  LV:        4.4  mm  Est. FW:    3199  gm      7 lb 1 oz     84  % ---------------------------------------------------------------------- OB History  Gravidity:    1 ---------------------------------------------------------------------- Gestational Age  LMP:           36w 1d        Date:  12/22/18                 EDD:   09/28/19  U/S Today:     37w 6d                                        EDD:   09/16/19  Best:          36w 1d     Det. By:  LMP  (12/22/18)          EDD:   09/28/19 ---------------------------------------------------------------------- Anatomy  Cranium:               Appears normal         Aortic Arch:            Previously seen  Cavum:                 Appears normal         Ductal Arch:            Previously seen  Ventricles:            Appears normal         Diaphragm:              Appears normal  Choroid Plexus:        Previously seen         Stomach:                Appears normal, left                                                                        sided  Cerebellum:            Previously seen        Abdomen:                Appears normal  Posterior Fossa:       Previously seen        Abdominal Wall:         Previously seen  Nuchal Fold:           Previously seen        Cord Vessels:  Previously seen  Face:                  Orbits and profile     Kidneys:                Appear normal                         previously seen  Lips:                  Previously seen        Bladder:                Appears normal  Thoracic:              Appears normal         Spine:                  Previously seen  Heart:                 Previously seen        Upper Extremities:      Previously seen  RVOT:                  Previously seen        Lower Extremities:      Previously seen  LVOT:                  Previously seen  Other:  Parents do not wish to know sex of fetus. Heels and 5th digit          visualized. Nasal bone visualized. Open hands visualized. ---------------------------------------------------------------------- Cervix Uterus Adnexa  Cervix  Not visualized (advanced GA >24wks) ---------------------------------------------------------------------- Comments  This patient was seen for a follow up growth scan due to  recently diagnosed gestational hypertension that is not  currently treated with any medications.  She denies any other  problems in her current pregnancy.  She was informed that the fetal growth and amniotic fluid  level appears appropriate for her gestational age.  A biophysical profile performed today was 8 out of 8.  The patient reports that due to gestational hypertension, she  is scheduled for delivery sometime next week.  No further exams were scheduled in our office. ----------------------------------------------------------------------                   Johnell Comings, MD Electronically Signed Final Report   09/01/2019  04:13 pm ----------------------------------------------------------------------   Assessment and Plan:  Pregnancy: G1P0000 at [redacted]w[redacted]d 1. Gestational hypertension, third trimester Stable BP, no severe features. IOL scheduled at 37 weeks, orders placed. All questions answered about IOL process.  She was offered outpatient foley placement, she declined this option.  - Fetal nonstress test performed today was reviewed and was found to be reactive. BPP 8/8 on 09/01/19.  EFW 84% (see report above).  2. Group B streptococcal bacteriuria Will get PCN intrapartum prophylaxis  3. Supervision of high risk pregnancy in third trimester Labor symptoms and general obstetric precautions including but not limited to vaginal bleeding, contractions, leaking of fluid and fetal movement were reviewed in detail with the patient. Please refer to After Visit Summary for other counseling recommendations.   Return for Postpartum BP check and PP visit.  Future Appointments  Date Time Provider [place]  09/07/2019 12:00 AM [facility] SCHED ROOM [facility] None    Verita Schneiders, MD

## 2019-09-05 ENCOUNTER — Other Ambulatory Visit (HOSPITAL_COMMUNITY): Payer: BC Managed Care – PPO

## 2019-09-05 ENCOUNTER — Other Ambulatory Visit: Payer: Self-pay | Admitting: Advanced Practice Midwife

## 2019-09-05 ENCOUNTER — Encounter: Payer: Self-pay | Admitting: *Deleted

## 2019-09-05 ENCOUNTER — Other Ambulatory Visit (HOSPITAL_COMMUNITY)
Admission: RE | Admit: 2019-09-05 | Discharge: 2019-09-05 | Disposition: A | Discharge status: Home / Self Care | Payer: BC Managed Care – PPO | Source: Ambulatory Visit | Attending: Family Medicine | Admitting: Family Medicine

## 2019-09-05 ENCOUNTER — Telehealth (HOSPITAL_COMMUNITY): Payer: Self-pay | Admitting: *Deleted

## 2019-09-05 DIAGNOSIS — Z01812 Encounter for preprocedural laboratory examination: Secondary | ICD-10-CM | POA: Insufficient documentation

## 2019-09-05 DIAGNOSIS — Z20822 Contact with and (suspected) exposure to covid-19: Secondary | ICD-10-CM | POA: Insufficient documentation

## 2019-09-05 LAB — SARS CORONAVIRUS 2 (TAT 6-24 HRS): SARS Coronavirus 2: NEGATIVE

## 2019-09-05 NOTE — Telephone Encounter (Signed)
Preadmission screen  

## 2019-09-07 ENCOUNTER — Inpatient Hospital Stay (HOSPITAL_COMMUNITY)
Admission: AD | Admit: 2019-09-07 | Payer: BC Managed Care – PPO | Source: Home / Self Care | Admitting: Obstetrics & Gynecology

## 2019-09-07 ENCOUNTER — Other Ambulatory Visit: Payer: Self-pay

## 2019-09-07 ENCOUNTER — Inpatient Hospital Stay (HOSPITAL_COMMUNITY): Admission: RE | Admit: 2019-09-07 | Payer: BC Managed Care – PPO | Source: Ambulatory Visit

## 2019-09-07 ENCOUNTER — Inpatient Hospital Stay (HOSPITAL_COMMUNITY): Payer: BC Managed Care – PPO | Admitting: Anesthesiology

## 2019-09-07 ENCOUNTER — Inpatient Hospital Stay (HOSPITAL_COMMUNITY)
Admission: AD | Admit: 2019-09-07 | Discharge: 2019-09-10 | DRG: 807 | Disposition: A | Discharge status: Home / Self Care | Payer: BC Managed Care – PPO | Attending: Obstetrics and Gynecology | Admitting: Obstetrics and Gynecology

## 2019-09-07 ENCOUNTER — Encounter (HOSPITAL_COMMUNITY): Payer: Self-pay | Admitting: Obstetrics & Gynecology

## 2019-09-07 DIAGNOSIS — O0993 Supervision of high risk pregnancy, unspecified, third trimester: Secondary | ICD-10-CM

## 2019-09-07 DIAGNOSIS — Z20822 Contact with and (suspected) exposure to covid-19: Secondary | ICD-10-CM | POA: Diagnosis present

## 2019-09-07 DIAGNOSIS — O133 Gestational [pregnancy-induced] hypertension without significant proteinuria, third trimester: Secondary | ICD-10-CM | POA: Diagnosis present

## 2019-09-07 DIAGNOSIS — R0789 Other chest pain: Secondary | ICD-10-CM

## 2019-09-07 DIAGNOSIS — O99824 Streptococcus B carrier state complicating childbirth: Secondary | ICD-10-CM | POA: Diagnosis present

## 2019-09-07 DIAGNOSIS — Z3A37 37 weeks gestation of pregnancy: Secondary | ICD-10-CM

## 2019-09-07 DIAGNOSIS — J45909 Unspecified asthma, uncomplicated: Secondary | ICD-10-CM | POA: Diagnosis present

## 2019-09-07 DIAGNOSIS — O134 Gestational [pregnancy-induced] hypertension without significant proteinuria, complicating childbirth: Principal | ICD-10-CM | POA: Diagnosis present

## 2019-09-07 DIAGNOSIS — O9952 Diseases of the respiratory system complicating childbirth: Secondary | ICD-10-CM | POA: Diagnosis present

## 2019-09-07 DIAGNOSIS — R8271 Bacteriuria: Secondary | ICD-10-CM | POA: Diagnosis present

## 2019-09-07 LAB — CBC
HCT: 33.9 % — ABNORMAL LOW (ref 36.0–46.0)
HCT: 35.9 % — ABNORMAL LOW (ref 36.0–46.0)
Hemoglobin: 11.6 g/dL — ABNORMAL LOW (ref 12.0–15.0)
Hemoglobin: 12.3 g/dL (ref 12.0–15.0)
MCH: 31.5 pg (ref 26.0–34.0)
MCH: 31.8 pg (ref 26.0–34.0)
MCHC: 34.2 g/dL (ref 30.0–36.0)
MCHC: 34.3 g/dL (ref 30.0–36.0)
MCV: 92.1 fL (ref 80.0–100.0)
MCV: 92.8 fL (ref 80.0–100.0)
Platelets: 195 10*3/uL (ref 150–400)
Platelets: 194 10*3/uL (ref 150–400)
RBC: 3.68 MIL/uL — ABNORMAL LOW (ref 3.87–5.11)
RBC: 3.87 MIL/uL (ref 3.87–5.11)
RDW: 12.5 % (ref 11.5–15.5)
RDW: 12.6 % (ref 11.5–15.5)
WBC: 8.6 10*3/uL (ref 4.0–10.5)
WBC: 12.6 10*3/uL — ABNORMAL HIGH (ref 4.0–10.5)
nRBC: 0 % (ref 0.0–0.2)
nRBC: 0 % (ref 0.0–0.2)

## 2019-09-07 LAB — COMPREHENSIVE METABOLIC PANEL
ALT: 33 U/L (ref 0–44)
AST: 40 U/L (ref 15–41)
Albumin: 2.6 g/dL — ABNORMAL LOW (ref 3.5–5.0)
Alkaline Phosphatase: 128 U/L — ABNORMAL HIGH (ref 38–126)
Anion gap: 11 (ref 5–15)
BUN: 7 mg/dL (ref 6–20)
CO2: 19 mmol/L — ABNORMAL LOW (ref 22–32)
Calcium: 8.9 mg/dL (ref 8.9–10.3)
Chloride: 106 mmol/L (ref 98–111)
Creatinine, Ser: 0.81 mg/dL (ref 0.44–1.00)
GFR calc Af Amer: >60 mL/min (ref >60)
GFR calc non Af Amer: >60 mL/min (ref >60)
Glucose, Bld: 120 mg/dL — ABNORMAL HIGH (ref 70–99)
Potassium: 3.8 mmol/L (ref 3.5–5.1)
Sodium: 136 mmol/L (ref 135–145)
Total Bilirubin: 0.3 mg/dL (ref 0.3–1.2)
Total Protein: 5.4 g/dL — ABNORMAL LOW (ref 6.5–8.1)

## 2019-09-07 LAB — TYPE AND SCREEN
ABO/RH(D): A POS
Antibody Screen: NEGATIVE

## 2019-09-07 LAB — PROTEIN / CREATININE RATIO, URINE
Creatinine, Urine: 47.87 mg/dL
Protein Creatinine Ratio: 0.19 mg/mg{Cre} — ABNORMAL HIGH (ref 0.00–0.15)
Total Protein, Urine: 9 mg/dL

## 2019-09-07 LAB — ABO/RH: ABO/RH(D): A POS

## 2019-09-07 LAB — RPR: RPR Ser Ql: NONREACTIVE

## 2019-09-07 MED ORDER — EPHEDRINE 5 MG/ML INJ: 10.0000 mg | INTRAVENOUS | Status: DC | PRN

## 2019-09-07 MED ORDER — HYDROXYZINE HCL 50 MG PO TABS: 50.0000 mg | ORAL_TABLET | Freq: Four times a day (QID) | ORAL | Status: DC | PRN

## 2019-09-07 MED ORDER — OXYCODONE-ACETAMINOPHEN 5-325 MG PO TABS: 2.0000 | ORAL_TABLET | ORAL | Status: DC | PRN

## 2019-09-07 MED ORDER — TERBUTALINE SULFATE 1 MG/ML IJ SOLN: 0.2500 mg | Freq: Once | INTRAMUSCULAR | Status: DC | PRN

## 2019-09-07 MED ORDER — HYDRALAZINE HCL 20 MG/ML IJ SOLN: 5.0000 mg | INTRAMUSCULAR | Status: DC | PRN

## 2019-09-07 MED ORDER — SODIUM CHLORIDE 0.9 % IV SOLN
5.0000 10*6.[IU] | Freq: Once | INTRAVENOUS | Status: AC
  Administered 2019-09-07: 5 10*6.[IU] via INTRAVENOUS
  Filled 2019-09-07: qty 5

## 2019-09-07 MED ORDER — ACETAMINOPHEN 325 MG PO TABS: 650.0000 mg | ORAL_TABLET | ORAL | Status: DC | PRN

## 2019-09-07 MED ORDER — SOD CITRATE-CITRIC ACID 500-334 MG/5ML PO SOLN: 30.0000 mL | ORAL | Status: DC | PRN

## 2019-09-07 MED ORDER — OXYTOCIN 40 UNITS IN NORMAL SALINE INFUSION - SIMPLE MED
2.5000 [IU]/h | INTRAVENOUS | Status: DC
  Administered 2019-09-09: 01:00:00 2.5 [IU]/h via INTRAVENOUS

## 2019-09-07 MED ORDER — ZOLPIDEM TARTRATE 5 MG PO TABS
5.0000 mg | ORAL_TABLET | Freq: Every evening | ORAL | Status: DC | PRN
  Administered 2019-09-07: 5 mg via ORAL
  Filled 2019-09-07: qty 1

## 2019-09-07 MED ORDER — LACTATED RINGERS IV SOLN
500.0000 mL | INTRAVENOUS | Status: DC | PRN
  Administered 2019-09-07 – 2019-09-08 (×4): 500 mL via INTRAVENOUS

## 2019-09-07 MED ORDER — LACTATED RINGERS IV SOLN
500.0000 mL | Freq: Once | INTRAVENOUS | Status: AC
  Administered 2019-09-07: 500 mL via INTRAVENOUS

## 2019-09-07 MED ORDER — PHENYLEPHRINE 40 MCG/ML (10ML) SYRINGE FOR IV PUSH (FOR BLOOD PRESSURE SUPPORT)
80.0000 ug | PREFILLED_SYRINGE | INTRAVENOUS | Status: DC | PRN
  Filled 2019-09-07: qty 10

## 2019-09-07 MED ORDER — OXYTOCIN 40 UNITS IN NORMAL SALINE INFUSION - SIMPLE MED
1.0000 m[IU]/min | INTRAVENOUS | Status: DC
  Administered 2019-09-07: 2 m[IU]/min via INTRAVENOUS
  Administered 2019-09-08: 16 m[IU]/min via INTRAVENOUS
  Administered 2019-09-08: 15:00:00 18 m[IU]/min via INTRAVENOUS
  Administered 2019-09-08: 06:00:00 8 m[IU]/min via INTRAVENOUS
  Administered 2019-09-08: 21:00:00 2 m[IU]/min via INTRAVENOUS
  Filled 2019-09-07 (×2): qty 1000

## 2019-09-07 MED ORDER — LABETALOL HCL 5 MG/ML IV SOLN: 20.0000 mg | INTRAVENOUS | Status: DC | PRN

## 2019-09-07 MED ORDER — MISOPROSTOL 50MCG HALF TABLET: 50.0000 ug | ORAL_TABLET | ORAL | Status: DC | PRN

## 2019-09-07 MED ORDER — MISOPROSTOL 50MCG HALF TABLET
50.0000 ug | ORAL_TABLET | ORAL | Status: DC
  Administered 2019-09-07: 50 ug via BUCCAL
  Filled 2019-09-07: qty 1

## 2019-09-07 MED ORDER — LIDOCAINE HCL (PF) 1 % IJ SOLN
30.0000 mL | INTRAMUSCULAR | Status: AC | PRN
  Administered 2019-09-09: 30 mL via SUBCUTANEOUS
  Filled 2019-09-07: qty 30

## 2019-09-07 MED ORDER — HYDRALAZINE HCL 20 MG/ML IJ SOLN: 10.0000 mg | INTRAMUSCULAR | Status: DC | PRN

## 2019-09-07 MED ORDER — LABETALOL HCL 5 MG/ML IV SOLN: 40.0000 mg | INTRAVENOUS | Status: DC | PRN

## 2019-09-07 MED ORDER — FENTANYL CITRATE (PF) 100 MCG/2ML IJ SOLN
50.0000 ug | INTRAMUSCULAR | Status: DC | PRN
  Administered 2019-09-07: 50 ug via INTRAVENOUS
  Administered 2019-09-07: 100 ug via INTRAVENOUS
  Filled 2019-09-07: qty 2

## 2019-09-07 MED ORDER — ONDANSETRON HCL 4 MG/2ML IJ SOLN
4.0000 mg | Freq: Four times a day (QID) | INTRAMUSCULAR | Status: DC | PRN
  Administered 2019-09-07 – 2019-09-08 (×2): 4 mg via INTRAVENOUS
  Filled 2019-09-07 (×2): qty 2

## 2019-09-07 MED ORDER — PENICILLIN G POT IN DEXTROSE 60000 UNIT/ML IV SOLN
3.0000 10*6.[IU] | INTRAVENOUS | Status: DC
  Administered 2019-09-07 – 2019-09-08 (×11): 3 10*6.[IU] via INTRAVENOUS
  Filled 2019-09-07 (×11): qty 50

## 2019-09-07 MED ORDER — DIPHENHYDRAMINE HCL 50 MG/ML IJ SOLN: 12.5000 mg | INTRAMUSCULAR | Status: DC | PRN

## 2019-09-07 MED ORDER — OXYTOCIN 40 UNITS IN NORMAL SALINE INFUSION - SIMPLE MED: 1.0000 m[IU]/min | INTRAVENOUS | Status: DC

## 2019-09-07 MED ORDER — LACTATED RINGERS IV SOLN: INTRAVENOUS | Status: DC

## 2019-09-07 MED ORDER — OXYTOCIN BOLUS FROM INFUSION
500.0000 mL | Freq: Once | INTRAVENOUS | Status: AC
  Administered 2019-09-09: 500 mL via INTRAVENOUS

## 2019-09-07 MED ORDER — OXYCODONE-ACETAMINOPHEN 5-325 MG PO TABS: 1.0000 | ORAL_TABLET | ORAL | Status: DC | PRN

## 2019-09-07 MED ORDER — PHENYLEPHRINE 40 MCG/ML (10ML) SYRINGE FOR IV PUSH (FOR BLOOD PRESSURE SUPPORT): 80.0000 ug | PREFILLED_SYRINGE | INTRAVENOUS | Status: DC | PRN

## 2019-09-07 MED ORDER — LIDOCAINE HCL (PF) 1 % IJ SOLN
INTRAMUSCULAR | Status: DC | PRN
  Administered 2019-09-07: 10 mL via EPIDURAL

## 2019-09-07 MED ORDER — MISOPROSTOL 25 MCG QUARTER TABLET
25.0000 ug | ORAL_TABLET | ORAL | Status: DC | PRN
  Administered 2019-09-07 (×3): 25 ug via VAGINAL
  Filled 2019-09-07 (×3): qty 1

## 2019-09-07 MED ORDER — FENTANYL-BUPIVACAINE-NACL 0.5-0.125-0.9 MG/250ML-% EP SOLN
12.0000 mL/h | EPIDURAL | Status: DC | PRN
  Administered 2019-09-08: 15:00:00 12 mL/h via EPIDURAL
  Filled 2019-09-07 (×2): qty 250

## 2019-09-07 MED ORDER — TERBUTALINE SULFATE 1 MG/ML IJ SOLN
0.2500 mg | Freq: Once | INTRAMUSCULAR | Status: DC | PRN
  Filled 2019-09-07: qty 1

## 2019-09-07 MED ORDER — SODIUM CHLORIDE (PF) 0.9 % IJ SOLN
INTRAMUSCULAR | Status: DC | PRN
  Administered 2019-09-07: 12 mL/h via EPIDURAL

## 2019-09-07 MED ORDER — MISOPROSTOL 50MCG HALF TABLET: 50.0000 ug | ORAL_TABLET | ORAL | Status: DC

## 2019-09-07 MED ORDER — FLEET ENEMA 7-19 GM/118ML RE ENEM: 1.0000 | ENEMA | Freq: Every day | RECTAL | Status: DC | PRN

## 2019-09-07 NOTE — Progress Notes (Signed)
Labor Progress Note Erin Whitney is a 29 y.o. G1P0000 at [redacted]w[redacted]d presented for IOL for gHTN  S: Feeling more pressure and pain but it's bearable at this time.  O:  BP 128/83   Pulse (!) 104   Temp 98.7 F (37.1 C) (Axillary)   Resp 18   Ht 5\' 10"  (1.778 m)   Wt 104 kg   LMP 12/22/2018 (Exact Date)   BMI 32.90 kg/m   CVE: Dilation: 4 Effacement (%): 50 Cervical Position: Middle Station: -3, -2 Presentation: Vertex Exam by:: Delorise Shiner, RN   A&P: 29 y.o. G1P0000 [redacted]w[redacted]d for IOL for gHTN #Labor: Progressing well. Still in early stage of labor. Will start pitocin at this time #Pain: Epidural when patient requests #FWB: Category 1, EFW 3199g at 84%ile #GBS positive, cont PCN #gHTN: 148/72 at 1730 w/o sx. All other BP checks normotensive since last check  Alroy Bailiff, DO 7:28 PM

## 2019-09-07 NOTE — Progress Notes (Signed)
Labor Progress Note Erin Whitney is a 29 y.o. G1P0000 at [redacted]w[redacted]d presented for IOL for gHTN.    S: Feeling some cramping but overall comfortable.   O:  BP 126/76   Pulse 89   Temp 98.5 F (36.9 C) (Axillary)   Resp 16   Ht 5\' 10"  (1.778 m)   Wt 104 kg   LMP 12/22/2018 (Exact Date)   BMI 32.90 kg/m   EFM: 145, moderate variability, pos accels, no decels, reactive TOCO: irregular, q2-67m  CVE: Dilation: 1 Effacement (%): 40 Cervical Position: Posterior Station: -3 Presentation: Vertex Exam by:: Dr. Marice Potter   A&P: 29 y.o. G1P0000 [redacted]w[redacted]d IOL for gHTN.   #Labor: Progressing s/p Cytotec x3. Foley bulb placed manually and filled with 60 mL water; patient tolerated well. Pit/AROM PRN. Anticipate vaginal delivery.  #Pain: per patient request  #FWB: Cat I #GBS positive, GBS bacteruria, PCN  #gHTN:  Normotensive. Asymptomatic. Pr/Cr ratio 0.19, Plt 195.    Chauncey Mann, MD 10:34 AM

## 2019-09-07 NOTE — Progress Notes (Signed)
Labor Progress Note Erin Whitney is a 29 y.o. G1P0000 at [redacted]w[redacted]d presented for IOL for gHTN.    S: Patient sleeping.  Did not wake patient.   O:  BP 105/77   Pulse (!) 114   Temp 98.2 F (36.8 C) (Oral)   Resp 16   Ht 5\' 10"  (1.778 m)   Wt 104 kg   LMP 12/22/2018 (Exact Date)   BMI 32.90 kg/m   EFM: 130s/moderate variability/ pos accels, no decels  TOCO: every 3-5 minutes   CVE: Dilation: Fingertip Effacement (%): 50 Station: -3 Presentation: Vertex Exam by:: Carmelia Roller, RNC   A&P: 29 y.o. G1P0000 [redacted]w[redacted]d IOL for gHTN.   #Labor: Progressing s/p Cytotec x2. Continue with cervical ripening. Place foley when able. Anticipate vaginal delivery.  #Pain: per patient request  #FWB: Cat 1  #GBS positive, GBS bacteruria, PCN   #gHTN:  Normotensive. Asymptomatic. Pr/Cr ratio 0.19, Plt 195.    Lyndee Hensen, DO 6:14 AM

## 2019-09-07 NOTE — Anesthesia Preprocedure Evaluation (Signed)
Anesthesia Evaluation  Patient identified by MRN, date of birth, ID band Patient awake    Reviewed: Allergy & Precautions, H&P , NPO status , Patient's Chart, lab work & pertinent test results  History of Anesthesia Complications Negative for: history of anesthetic complications  Airway Mallampati: II  TM Distance: >3 FB Neck ROM: full    Dental no notable dental hx.    Pulmonary asthma ,    Pulmonary exam normal        Cardiovascular hypertension (gestational), Normal cardiovascular exam Rhythm:regular Rate:Normal     Neuro/Psych negative neurological ROS  negative psych ROS   GI/Hepatic negative GI ROS, Neg liver ROS,   Endo/Other  negative endocrine ROS  Renal/GU negative Renal ROS  negative genitourinary   Musculoskeletal negative musculoskeletal ROS (+)   Abdominal   Peds  Hematology negative hematology ROS (+)   Anesthesia Other Findings   Reproductive/Obstetrics (+) Pregnancy                             Anesthesia Physical Anesthesia Plan  ASA: II  Anesthesia Plan: Epidural   Post-op Pain Management:    Induction:   PONV Risk Score and Plan:   Airway Management Planned:   Additional Equipment:   Intra-op Plan:   Post-operative Plan:   Informed Consent: I have reviewed the patients History and Physical, chart, labs and discussed the procedure including the risks, benefits and alternatives for the proposed anesthesia with the patient or authorized representative who has indicated his/her understanding and acceptance.       Plan Discussed with:   Anesthesia Plan Comments:         Anesthesia Quick Evaluation

## 2019-09-07 NOTE — Progress Notes (Signed)
Labor Progress Note Erin Whitney is a 29 y.o. G1P0000 at [redacted]w[redacted]d presented for IOL for gHTN.    S: Feeling cramping and abdominal tightening.   O:  BP (!) 145/83   Pulse (!) 110   Temp 98.7 F (37.1 C) (Oral)   Resp 18   Ht 5\' 10"  (1.778 m)   Wt 104 kg   LMP 12/22/2018 (Exact Date)   BMI 32.90 kg/m   EFM: 145, moderate variability, pos accels, no decels, reactive TOCO: irregular, q24m  CVE: Dilation: 4 Effacement (%): 50 Cervical Position: Middle Station: -2 Presentation: Vertex Exam by:: Delorise Shiner, RN    A&P: 28 y.o. G1P0000 [redacted]w[redacted]d IOL for gHTN.   #Labor: Progressing s/p Cytotec x3 and foley bulb. Contracting frequently at this time but will plan for another Cytotec when able. Pit/AROM PRN. Anticipate vaginal delivery.  #Pain: per patient request  #FWB: Cat I #GBS positive, GBS bacteruria, PCN  #gHTN: Normotensive. Asymptomatic. Pr/Cr ratio 0.19, Plt 195.    Chauncey Mann, MD 1:55 PM

## 2019-09-07 NOTE — Anesthesia Procedure Notes (Signed)
Epidural Patient location during procedure: OB Start time: 09/07/2019 10:04 PM End time: 09/07/2019 10:15 PM  Staffing Anesthesiologist: Lidia Collum, MD Performed: anesthesiologist   Preanesthetic Checklist Completed: patient identified, IV checked, risks and benefits discussed, monitors and equipment checked, pre-op evaluation and timeout performed  Epidural Patient position: sitting Prep: DuraPrep Patient monitoring: heart rate, continuous pulse ox and blood pressure Approach: midline Location: L3-L4 Injection technique: LOR air  Needle:  Needle type: Tuohy  Needle gauge: 17 G Needle length: 9 cm Needle insertion depth: 6 cm Catheter type: closed end flexible Catheter size: 19 Gauge Catheter at skin depth: 11 cm Test dose: negative  Assessment Events: blood not aspirated, injection not painful, no injection resistance, no paresthesia and negative IV test  Additional Notes Reason for block:procedure for pain

## 2019-09-07 NOTE — H&P (Addendum)
OBSTETRIC ADMISSION HISTORY AND PHYSICAL  Erin Whitney is a 29 y.o. female G1P0000 with IUP at [redacted]w[redacted]d by LMP presenting for IOL due to gestational hypertension. She reports +FMs, No LOF, no VB, no blurry vision, headaches or peripheral edema, and RUQ pain.  She plans on breast feeding. She request POPs for birth control. She received her prenatal care at [facility]    Dating: By LMP --->  Estimated Date of Delivery: 09/28/19  Sono:    @[redacted]w[redacted]d , CWD, normal anatomy, cephalic presentation, EFW 3199g 7lb 1 oz 84% lie,    Prenatal History/Complications: Gestational hypertension  asthma GBS bacteruria    Past Medical History: Past Medical History:  Diagnosis Date  . Asthma   . Ovarian cyst     Past Surgical History: Past Surgical History:  Procedure Laterality Date  . WISDOM TOOTH EXTRACTION      Obstetrical History: OB History    Gravida  1   Para  0   Term  0   Preterm  0   AB  0   Living  0     SAB  0   TAB  0   Ectopic  0   Multiple  0   Live Births              Social History Social History   Socioeconomic History  . Marital status: Married    Spouse name: Not on file  . Number of children: Not on file  . Years of education: 4  . Highest education level: Not on file  Occupational History  . Occupation: Pharmacist, hospital    Comment: Kindergarten-[employer]  Tobacco Use  . Smoking status: Never Smoker  . Smokeless tobacco: Never Used  Substance and Sexual Activity  . Alcohol use: Yes    Alcohol/week: 1.0 standard drinks    Types: 1 Cans of beer per week    Comment: social  . Drug use: No  . Sexual activity: Yes    Birth control/protection: None  Other Topics Concern  . Not on file  Social History Narrative  . Not on file   Social Determinants of Health   Financial Resource Strain:   . Difficulty of Paying Living Expenses:   Food Insecurity:   . Worried About Charity fundraiser in the Last Year:   . Arboriculturist in the Last Year:    Transportation Needs:   . Film/video editor (Medical):   Marland Kitchen Lack of Transportation (Non-Medical):   Physical Activity:   . Days of Exercise per Week:   . Minutes of Exercise per Session:   Stress:   . Feeling of Stress :   Social Connections:   . Frequency of Communication with Friends and Family:   . Frequency of Social Gatherings with Friends and Family:   . Attends Religious Services:   . Active Member of Clubs or Organizations:   . Attends Archivist Meetings:   Marland Kitchen Marital Status:     Family History: Family History  Problem Relation Age of Onset  . Diabetes Mother   . Heart disease Mother   . Cancer Mother        bladder  . Diabetes Father   . Cancer Paternal Grandfather        melonoma  . Diabetes Maternal Grandmother   . Cancer Maternal Grandmother        cervical    Allergies: No Known Allergies  Medications Prior to Admission  Medication Sig Dispense  Refill Last Dose  . albuterol (VENTOLIN HFA) 108 (90 Base) MCG/ACT inhaler Inhale 2 puffs into the lungs every 6 (six) hours as needed for wheezing or shortness of breath. 18 g 1   . Prenatal Vit-Fe Fumarate-FA (MULTIVITAMIN-PRENATAL) 27-0.8 MG TABS tablet Take 1 tablet by mouth daily at 12 noon.        Review of Systems   All systems reviewed and negative except as stated in HPI  Blood pressure 134/81, pulse (!) 103, resp. rate 18, last menstrual period 12/22/2018. General appearance: alert and no distress Lungs: clear to auscultation bilaterally Heart: regular rate and rhythm Abdomen: soft, non-tender; bowel sounds normal Pelvic: gravid uterus  Extremities: Homans sign is negative, no sign of DVT Presentation: cephalic Fetal monitoringBaseline: 145 bpm, Variability: Good {> 6 bpm), Accelerations: Reactive and Decelerations: Absent Uterine activity: None     Prenatal labs: ABO, Rh: A/Positive/-- (10/20 [id number]) Antibody: Negative (10/20 [id number]) Rubella: 2.04 (10/20 [id number]) RPR: Non Reactive  (02/11 0906)  HBsAg: Negative (10/20 [id number])  HIV: Non Reactive (02/11 0906)  GBS:    2 hr Glucola:  80, 104, 130 Genetic screening declined  Anatomy US WNL  Prenatal Transfer Tool  Maternal Diabetes: No Genetic Screening: Declined Maternal Ultrasounds/Referrals: Normal Fetal Ultrasounds or other Referrals:  None Maternal Substance Abuse:  No Significant Maternal Medications:  None Significant Maternal Lab Results: Group B Strep positive  No results found for this or any previous visit (from the past 24 hour(s)).  Patient Active Problem List   Diagnosis Date Noted  . Gestational hypertension, third trimester 07/30/2019  . Chest pain 05/03/2019  . Obesity in pregnancy 04/04/2019  . Group B streptococcal bacteriuria 03/13/2019  . Supervision of high-risk pregnancy 03/07/2019  . Other and unspecified ovarian cyst 01/17/2013  . Asthma in adult 01/17/2013    Assessment/Plan:  Erin Whitney is a 29 y.o. G1P0000 at [redacted]w[redacted]d here for gHTN.  #Labor: Admit to L&D. Discussed risks and benefits of labor augmentation.  Patient agreeable to Cytotec. Reassess in 3-4 hours.  #Pain: Per patient request  #FWB: Cat 1  #ID:  GBS bacteruria, PCN  #MOF: breast  #MOC: POPs #Circ:  NA, girl   #gHTN: Normotensive and asymptomatic. Pre-E labs pending.    Lyndee Hensen, DO  09/07/2019, 12:35 AM  CNM attestation:  I have seen and examined this patient; I agree with above documentation in the resident's note.   Erin Whitney is a 29 y.o. G1P0000 here for IOL due to gHTN. She has had labile BPs during portions of the pregnancy and was officially dx with gHTN at approx 35wks. Denies s/s pre-e.  PE: BP (!) 106/50   Pulse 83   Temp 98.2 F (36.8 C) (Oral)   Resp 18   Ht 5\' 10"  (1.778 m)   Wt 104 kg   LMP 12/22/2018 (Exact Date)   BMI 32.90 kg/m  Gen: calm comfortable, NAD Resp: normal effort, no distress Abd: gravid  ROS, labs, PMH reviewed  P/C ratio: 0.19 CMP: neg for  pre-e  Plan: -Admit to Labor and Delivery -Plan cx ripening with cytotec then cervical foley to start, then Pit/AROM prn -PCN for GBS ppx -Anticipate vag del after most likely lengthy IOL process  Myrtis Ser CNM 09/07/2019, 2:34 AM

## 2019-09-07 NOTE — Progress Notes (Signed)
Patient ID: Erin Whitney, female   DOB: 02/09/1991, 29 y.o.   MRN: [id number] Doing well, comfortable with UCs but discomfort is increasing  Vitals:   09/07/19 1905 09/07/19 1952 09/07/19 2003 09/07/19 2030  BP: 128/83 129/85 134/84 116/64  Pulse: (!) 104 91 99 98  Resp: 18 17 17 16   Temp:  98.8 F (37.1 C)    TempSrc:  Oral    Weight:      Height:       Fetal heart rate reactive Category I + accels No decels  Cervical exam deferred Last Exam Dilation: 4.5(to 5) Effacement (%): 50 Cervical Position: Middle Station: -2 Presentation: Vertex Exam by:: Massie Maroon RN/Dr. Juleen China    Will continue plan of care

## 2019-09-08 MED ORDER — FENTANYL CITRATE (PF) 100 MCG/2ML IJ SOLN
INTRAMUSCULAR | Status: DC | PRN
  Administered 2019-09-08: 15 ug via INTRATHECAL

## 2019-09-08 MED ORDER — BUPIVACAINE HCL (PF) 0.25 % IJ SOLN
INTRAMUSCULAR | Status: DC | PRN
  Administered 2019-09-08: .6 mL via EPIDURAL
  Administered 2019-09-08: 10 mL via EPIDURAL

## 2019-09-08 MED ORDER — LIDOCAINE-EPINEPHRINE (PF) 2 %-1:200000 IJ SOLN
INTRAMUSCULAR | Status: DC | PRN
  Administered 2019-09-08: 5 mL via EPIDURAL

## 2019-09-08 NOTE — Progress Notes (Signed)
Patient ID: Erin Whitney, female   DOB: [birth year], 29 y.o.   MRN: [id number] Vitals:   09/07/19 2255 09/07/19 2300 09/07/19 2330 09/08/19 0000  BP: 122/62 116/72 115/69 119/69  Pulse: 75 82 84 83  Resp:  16 17 17   Temp:      TempSrc:      SpO2:      Weight:      Height:       Fetal heart rate 145 with accels UCs every 1-2 min, lasting 30-40 seconds  Dilation: 4.5(to 5) Effacement (%): 50 Cervical Position: Middle Station: -2 Presentation: Vertex Exam by:: Massie Maroon RN  Pitocin being increased per protocol

## 2019-09-08 NOTE — Progress Notes (Signed)
Labor Progress Note TABIATHA TIMBS is a 29 y.o. G1P0000 at [redacted]w[redacted]d presented for IOL for gHTN.    S: Patient resting comfortably.   O:  BP 129/68   Pulse 75   Temp 98.5 F (36.9 C) (Oral)   Resp 17   Ht 5\' 10"  (1.778 m)   Wt 104 kg   LMP 12/22/2018 (Exact Date)   SpO2 98%   BMI 32.90 kg/m   EFM: 140 / moderate variability / pos accels, no decels TOCO: unable to determine   CVE: Dilation: 5.5 Effacement (%): 70 Cervical Position: Middle Station: -2 Presentation: Vertex Exam by:: Dr. Susa Simmonds   A&P: 29 y.o. G1P0000 [redacted]w[redacted]d IOL for gHTN.   #Labor: Progressing s/p Cytotecx4 and foley bulb.  Continue pitocin. Discussed AROM but patient would like to wait at this time. Anticipate vaginal delivery.  #Pain: epidural #FWB: Cat 1  #GBS positive, GBS bacteruria, PCN   #gHTN:  Normotensive and asymptomatic.    Jakiya Bookbinder, DO 4:30 AM

## 2019-09-08 NOTE — Anesthesia Procedure Notes (Signed)
Epidural Patient location during procedure: OB Start time: 09/08/2019 1:30 PM End time: 09/08/2019 2:00 PM  Staffing Anesthesiologist: Pervis Hocking, DO Performed: anesthesiologist   Preanesthetic Checklist Completed: patient identified, IV checked, risks and benefits discussed, monitors and equipment checked, pre-op evaluation and timeout performed  Epidural Patient position: sitting Prep: DuraPrep and site prepped and draped Patient monitoring: continuous pulse ox, blood pressure, heart rate and cardiac monitor Approach: midline Location: L3-L4 Injection technique: LOR air  Needle:  Needle type: Tuohy  Needle gauge: 17 G Needle length: 9 cm Needle insertion depth: 7 cm Catheter type: closed end flexible Catheter size: 19 Gauge Catheter at skin depth: 12 cm  Assessment Sensory level: T8 Events: blood not aspirated, injection not painful, no injection resistance, no paresthesia and negative IV test  Additional Notes CSE performed Reason for block:procedure for pain

## 2019-09-08 NOTE — Progress Notes (Signed)
Erin Whitney is a 29 y.o. G1P0000 at [redacted]w[redacted]d admitted for IOL 2/2 gHTN  Subjective: Comfortable with epidural  Objective: BP 128/71   Pulse 90   Temp 99.1 F (37.3 C) (Oral)   Resp 17   Ht 5\' 10"  (1.778 m)   Wt 104 kg   LMP 12/22/2018 (Exact Date)   SpO2 99%   BMI 32.90 kg/m  Total I/O In: 2717.5 [I.V.:2717.5] Out: 2350 [Urine:2350]  FHT:  FHR: 150 bpm, variability: moderate,  accelerations:  Present,  decelerations:  Present variables UC:   regular, every 2-3 minutes, MVUs ~150  SVE:   Dilation: 7 Effacement (%): 90 Station: -1 Exam by:: Krystal Eaton, RN  Pitocin @ 20 mu/min  Labs: Lab Results  Component Value Date   WBC 12.6 (H) 09/07/2019   HGB 12.3 09/07/2019   HCT 35.9 (L) 09/07/2019   MCV 92.8 09/07/2019   PLT 194 09/07/2019    Assessment / Plan: 29 y.o.G1P0000 [redacted]w[redacted]d for IOL for gHTN  #Labor: S/p cyto x4, FB,AROM and IUPC. On pit since 1937 last night, now at 20. Contractions had previously been adequate, no longer at this time (MVUs ~150). Will start pit break at this time, plan to restart in 4 hours. #FWB: Cat 1, occasional variables #GBS:positive, PCN #GHTN: No severe range pressures, Pre-E labs normal  Merilyn Baba DO OB Fellow, Faculty Practice 09/08/2019, 4:44 PM

## 2019-09-08 NOTE — Progress Notes (Signed)
Erin Whitney is a 29 y.o. G1P0000 at [redacted]w[redacted]d admitted for IOL 2/2 gHTN  Subjective: Feeling more pain despite epidural  Objective: BP 132/82   Pulse 80   Temp 99.3 F (37.4 C) (Oral)   Resp 20   Ht 5\' 10"  (1.778 m)   Wt 104 kg   LMP 12/22/2018 (Exact Date)   SpO2 98%   BMI 32.90 kg/m  Total I/O In: 2717.5 [I.V.:2717.5] Out: 1050 [Urine:1050]  FHT:  FHR: 135 bpm, variability: moderate,  accelerations:  Present,  decelerations:  Absent UC:   regular, every 2-3 minutes  SVE:   Dilation: 6.5 Effacement (%): 70 Station: -2 Exam by:: E. Lazarus Salines, RNC  Pitocin @ 14 mu/min  Labs: Lab Results  Component Value Date   WBC 12.6 (H) 09/07/2019   HGB 12.3 09/07/2019   HCT 35.9 (L) 09/07/2019   MCV 92.8 09/07/2019   PLT 194 09/07/2019    Assessment / Plan: 29 y.o. G1P0000 [redacted]w[redacted]d for IOL for gHTN  #Labor: S/p cyto x4, FB, AROM and IUPC. On pit since 1937 last night. Forebag felt upon examination, ruptured with clear fluid at this time. #FWB: Cat 1 #GBS: positive, PCN GHTN: No severe range pressures, Pre-E labs normal  Merilyn Baba DO OB Fellow, Faculty Practice 09/08/2019, 1:09 PM

## 2019-09-08 NOTE — Progress Notes (Signed)
Labor Progress Note Erin Whitney is a 29 y.o. G1P0000 at [redacted]w[redacted]d presented for IOL for gHTN.   S: Feeling more pressure but pain is well controlled.   O:  BP 126/63   Pulse 67   Temp (!) 97.5 F (36.4 C) (Oral)   Resp 17   Ht 5\' 10"  (1.778 m)   Wt 104 kg   LMP 12/22/2018 (Exact Date)   SpO2 98%   BMI 32.90 kg/m   CVE: Dilation: 6.5 Effacement (%): 70 Cervical Position: Middle Station: -2 Presentation: Vertex Exam by:: Dr. Juleen China   A&P: 29 y.o. G1P0000 [redacted]w[redacted]d for IOL for gHTN #Labor: Progressing well. AROM and IUPC insertion at 0906. S/p cytotec x 4, pit started at 1937 then decreased O/N #Pain: As patient requests #FWB: 145 baseline, Cat 1 strip without accels or decels, EFW 3199g at 84th%ile #GBS positive, adequate abx given GHTN: BP readings overnight and this AM WNL. Will CTM.   Alroy Bailiff, DO 9:22 AM

## 2019-09-08 NOTE — Progress Notes (Signed)
Labor Progress Note Erin Whitney is a 29 y.o. G1P0000 at [redacted]w[redacted]d presented for IOL for gHTN.    S: Patient comfortable s/p epidural.   O:  BP 124/82   Pulse 95   Temp 98.9 F (37.2 C) (Oral)   Resp 18   Ht 5\' 10"  (1.778 m)   Wt 104 kg   LMP 12/22/2018 (Exact Date)   SpO2 99%   BMI 32.90 kg/m   EFM: 140 / moderate variability / pos accels, no decels  TOCO: 2-4 minutes   CVE: Dilation: 7 Effacement (%): 90 Cervical Position: Middle Station: -1 Presentation: Vertex Exam by:: Carmelia Roller, RNC   A&P: 29 y.o. G1P0000 [redacted]w[redacted]d IOL for gHTN.   #Labor: Progressing s/p Cytotecx4, foley bulb, AROM and placement of IUPC. Took 4 hour pitocin break for inadequate contractions.  Pitocin was restarted at 2045 (2x2).  #Pain: epidural #FWB: Cat 1  #GBS positive, GBS bacteruria, PCN   #gHTN:  Normotensive, asymptomatic   Lyndee Hensen, DO 9:27 PM

## 2019-09-09 ENCOUNTER — Encounter (HOSPITAL_COMMUNITY): Payer: Self-pay | Admitting: Obstetrics & Gynecology

## 2019-09-09 DIAGNOSIS — O134 Gestational [pregnancy-induced] hypertension without significant proteinuria, complicating childbirth: Principal | ICD-10-CM

## 2019-09-09 DIAGNOSIS — O99824 Streptococcus B carrier state complicating childbirth: Secondary | ICD-10-CM

## 2019-09-09 DIAGNOSIS — Z3A37 37 weeks gestation of pregnancy: Secondary | ICD-10-CM

## 2019-09-09 LAB — CBC
HCT: 36.7 % (ref 36.0–46.0)
Hemoglobin: 12.5 g/dL (ref 12.0–15.0)
MCH: 32 pg (ref 26.0–34.0)
MCHC: 34.1 g/dL (ref 30.0–36.0)
MCV: 93.9 fL (ref 80.0–100.0)
Platelets: 202 10*3/uL (ref 150–400)
RBC: 3.91 MIL/uL (ref 3.87–5.11)
RDW: 12.6 % (ref 11.5–15.5)
WBC: 17.1 10*3/uL — ABNORMAL HIGH (ref 4.0–10.5)
nRBC: 0 % (ref 0.0–0.2)

## 2019-09-09 MED ORDER — ONDANSETRON HCL 4 MG PO TABS: 4.0000 mg | ORAL_TABLET | ORAL | Status: DC | PRN

## 2019-09-09 MED ORDER — SIMETHICONE 80 MG PO CHEW: 80.0000 mg | CHEWABLE_TABLET | ORAL | Status: DC | PRN

## 2019-09-09 MED ORDER — ZOLPIDEM TARTRATE 5 MG PO TABS: 5.0000 mg | ORAL_TABLET | Freq: Every evening | ORAL | Status: DC | PRN

## 2019-09-09 MED ORDER — IBUPROFEN 600 MG PO TABS
600.0000 mg | ORAL_TABLET | Freq: Four times a day (QID) | ORAL | Status: DC
  Administered 2019-09-09 – 2019-09-10 (×7): 600 mg via ORAL
  Filled 2019-09-09 (×7): qty 1

## 2019-09-09 MED ORDER — COCONUT OIL OIL
1.0000 "application " | TOPICAL_OIL | Status: DC | PRN
  Administered 2019-09-10: 1 via TOPICAL

## 2019-09-09 MED ORDER — WITCH HAZEL-GLYCERIN EX PADS
1.0000 "application " | MEDICATED_PAD | CUTANEOUS | Status: DC | PRN
  Administered 2019-09-10: 1 via TOPICAL

## 2019-09-09 MED ORDER — ACETAMINOPHEN 325 MG PO TABS
650.0000 mg | ORAL_TABLET | ORAL | Status: DC | PRN
  Administered 2019-09-09: 650 mg via ORAL
  Filled 2019-09-09: qty 2

## 2019-09-09 MED ORDER — ONDANSETRON HCL 4 MG/2ML IJ SOLN: 4.0000 mg | INTRAMUSCULAR | Status: DC | PRN

## 2019-09-09 MED ORDER — PRENATAL MULTIVITAMIN CH
1.0000 | ORAL_TABLET | Freq: Every day | ORAL | Status: DC
  Administered 2019-09-09 – 2019-09-10 (×2): 1 via ORAL
  Filled 2019-09-09 (×2): qty 1

## 2019-09-09 MED ORDER — DIBUCAINE (PERIANAL) 1 % EX OINT: 1.0000 "application " | TOPICAL_OINTMENT | CUTANEOUS | Status: DC | PRN

## 2019-09-09 MED ORDER — BENZOCAINE-MENTHOL 20-0.5 % EX AERO
1.0000 "application " | INHALATION_SPRAY | CUTANEOUS | Status: DC | PRN
  Administered 2019-09-09: 1 via TOPICAL
  Filled 2019-09-09: qty 56

## 2019-09-09 MED ORDER — DIPHENHYDRAMINE HCL 25 MG PO CAPS: 25.0000 mg | ORAL_CAPSULE | Freq: Four times a day (QID) | ORAL | Status: DC | PRN

## 2019-09-09 MED ORDER — TETANUS-DIPHTH-ACELL PERTUSSIS 5-2.5-18.5 LF-MCG/0.5 IM SUSP: 0.5000 mL | Freq: Once | INTRAMUSCULAR | Status: DC

## 2019-09-09 MED ORDER — SENNOSIDES-DOCUSATE SODIUM 8.6-50 MG PO TABS
2.0000 | ORAL_TABLET | ORAL | Status: DC
  Administered 2019-09-10: 2 via ORAL
  Filled 2019-09-09: qty 2

## 2019-09-09 NOTE — Discharge Summary (Addendum)
Postpartum Discharge Summary  Date of Service updated     Patient Name: Erin Whitney DOB: [birth year] MRN: [id number]  Date of admission: 09/07/2019 Delivering Provider: Clarnce Flock   Date of discharge: 09/10/2019  Admitting diagnosis: Gestational hypertension, third trimester [O13.3] Intrauterine pregnancy: [redacted]w[redacted]d    Secondary diagnosis:  Active Problems:   Asthma in adult   Group B streptococcal bacteriuria   Gestational hypertension, third trimester   NSVD (normal spontaneous vaginal delivery)  Additional problems: None     Discharge diagnosis: Term Pregnancy Delivered and Gestational Hypertension                                                                                                Post partum procedures:none  Augmentation: AROM, Pitocin, Cytotec and Foley Balloon  Complications: None  Hospital course:  Induction of Labor With Vaginal Delivery   29y.o. yo G1P0000 at 318w2das admitted to the hospital 09/07/2019 for induction of labor.  Indication for induction: Gestational hypertension.  Patient had an uncomplicated labor course as follows: induced with miso x4, foley balloon, AROM, and pitocin. Had prolonged period at 7cm and was given pit break, after which she quickly progressed to complete and uncomplicated NSVD. Membrane Rupture Time/Date: 9:06 AM ,09/08/2019   Intrapartum Procedures: Episiotomy: None [1]                                         Lacerations:  1st degree [2]  Patient had delivery of a Viable infant.  Information for the patient's newborn:  TeTerrian, Ridlon0[[id number]]Delivery Method: Vaginal, Spontaneous(Filed from Delivery Summary)    09/09/2019  Details of delivery can be found in separate delivery note.  Patient had a routine postpartum course. Patient is discharged home 09/10/19. Delivery time: 12:02 AM    Magnesium Sulfate received: No BMZ received: No Rhophylac:N/A MMR:N/A Transfusion:No  Physical exam  Vitals:    09/09/19 1250 09/09/19 1650 09/09/19 2127 09/10/19 0546  BP: 130/79 134/76 116/71 118/70  Pulse: 68 66 69 76  Resp: '17 18 18 18  ' Temp: 98.6 F (37 C) 98.9 F (37.2 C) 98.7 F (37.1 C) 97.7 F (36.5 C)  TempSrc: Oral Oral Oral Oral  SpO2: 98% 97%  100%  Weight:      Height:       General: alert, cooperative and no distress Lochia: appropriate Uterine Fundus: firm Incision: Healing well with no significant drainage DVT Evaluation: No evidence of DVT seen on physical exam. Labs: Lab Results  Component Value Date   WBC 17.1 (H) 09/09/2019   HGB 12.5 09/09/2019   HCT 36.7 09/09/2019   MCV 93.9 09/09/2019   PLT 202 09/09/2019   CMP Latest Ref Rng & Units 09/07/2019  Glucose 70 - 99 mg/dL 120(H)  BUN 6 - 20 mg/dL 7  Creatinine 0.44 - 1.00 mg/dL 0.81  Sodium 135 - 145 mmol/L 136  Potassium 3.5 - 5.1 mmol/L 3.8  Chloride 98 - 111 mmol/L 106  CO2 22 - 32 mmol/L 19(L)  Calcium 8.9 - 10.3 mg/dL 8.9  Total Protein 6.5 - 8.1 g/dL 5.4(L)  Total Bilirubin 0.3 - 1.2 mg/dL 0.3  Alkaline Phos 38 - 126 U/L 128(H)  AST 15 - 41 U/L 40  ALT 0 - 44 U/L 33   Edinburgh Score: Edinburgh Postnatal Depression Scale Screening Tool 09/10/2019  I have been able to laugh and see the funny side of things. 0  I have looked forward with enjoyment to things. 0  I have blamed myself unnecessarily when things went wrong. 0  I have been anxious or worried for no good reason. 2  I have felt scared or panicky for no good reason. 1  Things have been getting on top of me. 0  I have been so unhappy that I have had difficulty sleeping. 0  I have felt sad or miserable. 0  I have been so unhappy that I have been crying. 0  The thought of harming myself has occurred to me. 0  Edinburgh Postnatal Depression Scale Total 3    Discharge instruction: per After Visit Summary and "Baby and Me Booklet".  After visit meds:  Allergies as of 09/10/2019   No Known Allergies     Medication List    TAKE these  medications   albuterol 108 (90 Base) MCG/ACT inhaler Commonly known as: VENTOLIN HFA Inhale 2 puffs into the lungs every 6 (six) hours as needed for wheezing or shortness of breath.   ibuprofen 600 MG tablet Commonly known as: ADVIL Take 1 tablet (600 mg total) by mouth every 6 (six) hours.   multivitamin-prenatal 27-0.8 MG Tabs tablet Take 1 tablet by mouth daily at 12 noon.   norethindrone 0.35 MG tablet Commonly known as: Ortho Micronor Take 1 tablet (0.35 mg total) by mouth daily.       Diet: routine diet  Activity: Advance as tolerated. Pelvic rest for 6 weeks.   Outpatient follow up:6 weeks Follow up Appt:No future appointments. Follow up Visit:  Please schedule this patient for Postpartum visit in: 6 weeks with the following provider: Any provider Virtual For C/S patients schedule nurse incision check in weeks 2 weeks: no High risk pregnancy complicated by: gHTN Delivery mode:  SVD Anticipated Birth Control:  POPs PP Procedures needed: BP check  Schedule Integrated [place] visit: no  Newborn Data: Live born female  Birth Weight:  3320 grams APGAR: 69, 9  Newborn Delivery   Birth date/time: 09/09/2019 00:02:00 Delivery type: Vaginal, Spontaneous      Baby Feeding: Breast Disposition:home with mother   09/10/2019 Starr Lake, CNM

## 2019-09-09 NOTE — Anesthesia Postprocedure Evaluation (Signed)
Anesthesia Post Note  Patient: Erin Whitney  Procedure(s) Performed: AN AD HOC LABOR EPIDURAL     Patient location during evaluation: Mother Baby Anesthesia Type: Epidural Level of consciousness: awake and alert Pain management: pain level controlled Vital Signs Assessment: post-procedure vital signs reviewed and stable Respiratory status: spontaneous breathing, nonlabored ventilation and respiratory function stable Cardiovascular status: stable Postop Assessment: no headache, no backache and epidural receding Anesthetic complications: no    Last Vitals:  Vitals:   09/09/19 0222 09/09/19 0355  BP: (!) 157/78 126/73  Pulse: 85 69  Resp: 18 18  Temp: 37 C 37.3 C  SpO2:      Last Pain:  Vitals:   09/09/19 0355  TempSrc: Oral  PainSc:    Pain Goal: Patients Stated Pain Goal: 7 (09/07/19 0721)                 Rayvon Char

## 2019-09-09 NOTE — Lactation Note (Signed)
This note was copied from a baby's chart. Lactation Consultation Note  Patient Name: Erin Whitney [id number] Date: 09/09/2019 Reason for consult: Initial assessment;Early term 37-38.6wks P1, 3 hour female infant. Per mom, infant latch well in L&D but not in the room. Tools given: hand pump and breast shells given due to mom having flat nipples. Per mom, infant has been sleepy, LC discussed hand expression and infant was given 72mls of colostrum by spoon and started cuing to breastfeed. LC assisted mom in pre-pumping breast with hand pump prior to Mom latching nfant on right breast using the football hold position. Infant would not sustain latch at first but after few attempts infant sustained latch and breastfed for 5 minutes. Mom hand expression additional 5 mls and infant took total of 7 mls of colostrum by spoon. Mom will wear breast shells in bra during the day to help evert nipple shaft out more to help with latch and pre-pump breast prior to latching infant. Mom shown how to use hand pump & how to disassemble, clean, & reassemble parts. Mom will continue to work on infant latching at breast and understands to call RN or [place] if she needs assistance or help with latching infant. Mom knows to breastfeed infant according to hunger cues, on demand and not to exceed 3 hours without breastfeeding infant. Reviewed Baby & Me book's Breastfeeding Basics.  Mom made aware of O/P services, breastfeeding support groups, community resources, and our phone # for post-discharge questions.   Maternal Data Formula Feeding for Exclusion: No Has patient been taught Hand Expression?: Yes Does the patient have breastfeeding experience prior to this delivery?: No  Feeding Feeding Type: Breast Fed  LATCH Score Latch: Repeated attempts needed to sustain latch, nipple held in mouth throughout feeding, stimulation needed to elicit sucking reflex.  Audible Swallowing: A few with stimulation  Type of  Nipple: Flat  Comfort (Breast/Nipple): Soft / non-tender  Hold (Positioning): Assistance needed to correctly position infant at breast and maintain latch.  LATCH Score: 6  Interventions Interventions: Breast feeding basics reviewed;Breast compression;Adjust position;Assisted with latch;Support pillows;Skin to skin;Position options;Breast massage;Expressed milk;Hand express;Shells;Pre-pump if needed  Lactation Tools Discussed/Used Tools: Shells;Pump Shell Type: (flat nipples) Breast pump type: Manual WIC Program: No Pump Review: Setup, frequency, and cleaning;Milk Storage Initiated by:: Vicente Serene, IBCLC Date initiated:: 09/09/19   Consult Status Consult Status: Follow-up Date: 09/09/19 Follow-up type: In-patient    Vicente Serene 09/09/2019, 3:52 AM

## 2019-09-09 NOTE — Lactation Note (Signed)
This note was copied from a baby's chart. Lactation Consultation Note  Patient Name: Erin Whitney [id number] Date: 09/09/2019 Reason for consult: Follow-up assessment;Early term 37-38.6wks;Primapara;1st time breastfeeding  P1 mother whose infant is now 47 hours old.  This is an ETI at 37+2 weeks.  Mother called for assistance with initiating the DEBP.  Baby had finished feeding and the RN gave a LATCH score of a 7.  Pump parts, assembly, disassembly and cleaning reviewed.  #24 flange size is appropriate at this time.  Mother was educated on how to observe nipples and to change to a larger flange size as needed.  Colostrum container provided and milk storage times reviewed.  Finger feeding demonstrated.  Mother will pump for 15 minutes after feeding to help increase milk supply for ETI.  She will feed back any EBM she obtains to baby.  Mother will use her EBM to rub into nipples/areolas for comfort.  Coconut oil offered but mother politely declined at this time.    Mother has a DEBP for home use.  Father present and supportive.  RN updated.   Maternal Data    Feeding Feeding Type: Breast Fed  LATCH Score Latch: Repeated attempts needed to sustain latch, nipple held in mouth throughout feeding, stimulation needed to elicit sucking reflex.  Audible Swallowing: A few with stimulation  Type of Nipple: Everted at rest and after stimulation(short shafted)  Comfort (Breast/Nipple): Soft / non-tender  Hold (Positioning): Assistance needed to correctly position infant at breast and maintain latch.  LATCH Score: 7  Interventions    Lactation Tools Discussed/Used Breast pump type: Double-Electric Breast Pump   Consult Status Consult Status: Follow-up Date: 09/10/19 Follow-up type: In-patient    [name] R [name] 09/09/2019, 4:20 PM

## 2019-09-09 NOTE — Lactation Note (Signed)
This note was copied from a baby's chart. Lactation Consultation Note  Patient Name: Erin Whitney [id number] Date: 09/09/2019 Reason for consult: Follow-up assessment;Early term 43-38.6wks  P1 mother whose infant is now 51 hours old.  This is an ETI at 37+2 weeks.    Baby was asleep in mother's arms when I arrived.  Mother had no immediate questions/concerns related to breast feeding.  According to the previous [place] note, mother has flat nipples.  She was given a manual pump and breast shells.  Mother is familiar with hand expression and encouraged to continue practicing hand expression and to feed back any EBM she obtains to baby.  Mother stated that baby has been sleepy.  Discussed the first "24 hours" for all newborns and reinforced how an early term infant can be very sleepy.  Reassured mother that this is typical behavior for a baby at this age.  Mother will feed again soon and will call for my assistance as needed.  Offered to initiate the DEBP to help stimulate the breasts and provide supplementation for baby.  Mother is interested in beginning to pump and will call me for the next feeding.  I will educate on pumping after the next feeding.    RN updated.   Maternal Data    Feeding Feeding Type: Breast Fed  LATCH Score Latch: (enc mom to call for latch)                 Interventions    Lactation Tools Discussed/Used     Consult Status Consult Status: Follow-up Date: 09/10/19 Follow-up type: In-patient    [name] R [name] 09/09/2019, 2:55 PM

## 2019-09-09 NOTE — Plan of Care (Signed)
  Problem: Education: Goal: Knowledge of General Education information will improve Description: Including pain rating scale, medication(s)/side effects and non-pharmacologic comfort measures Note: Observed mother latch infant to the breast. Infant latched, then stopped suckling. Assisted with positioning to obtain deeper latch and infant had good strong jaw movement and audible swallows at the breast. Encouraged mother to call for assistance with feedings as needed. Maxwell Caul, Leretha Dykes Dewy Rose

## 2019-09-10 MED ORDER — NORETHINDRONE 0.35 MG PO TABS
1.0000 | ORAL_TABLET | Freq: Every day | ORAL | 11 refills | Status: DC
Start: 2019-09-10 — End: 2020-05-01

## 2019-09-10 MED ORDER — IBUPROFEN 600 MG PO TABS: 600.0000 mg | ORAL_TABLET | Freq: Four times a day (QID) | ORAL | 0 refills | Status: DC

## 2019-09-10 NOTE — Progress Notes (Addendum)
Post Partum Day 1 Subjective: Patient doing well. Mom is breast feeding. Reports being ambulatory in the room.  Has been eating well and having flatus.  Pain is controlled.    Objective: Blood pressure 118/70, pulse 76, temperature 97.7 F (36.5 C), temperature source Oral, resp. rate 18, height 5\' 10"  (1.778 m), weight 104 kg, last menstrual period 12/22/2018, SpO2 100 %, unknown if currently breastfeeding.  Physical Exam:  General: alert and no distress Lochia: appropriate Uterine Fundus: firm Incision: NA DVT Evaluation: Negative Homan's sign. No cords or calf tenderness.  Recent Labs    09/07/19 1947 09/09/19 0130  HGB 12.3 12.5  HCT 35.9* 36.7    Assessment/Plan: Plan for discharge tomorrow or today if baby girl is able, Breastfeeding and Contraception : POPs   LOS: 3 days   Lyndee Hensen, DO  09/10/2019, 8:11 AM   I saw and evaluated the patient. I agree with the findings and the plan of care as documented in the resident's note. Patient with gHTN but BP's stable since delivery. Okay to discharge today if baby can; RN to page team for orders.  Barrington Ellison, MD [facility] Family Medicine Fellow, [facility] for [employer], [place]

## 2019-09-11 ENCOUNTER — Encounter: Payer: BC Managed Care – PPO | Admitting: Obstetrics and Gynecology

## 2019-09-18 ENCOUNTER — Encounter: Payer: BC Managed Care – PPO | Admitting: Obstetrics and Gynecology

## 2019-09-25 ENCOUNTER — Ambulatory Visit (INDEPENDENT_AMBULATORY_CARE_PROVIDER_SITE_OTHER): Payer: BC Managed Care – PPO | Admitting: *Deleted

## 2019-09-25 ENCOUNTER — Encounter: Payer: BC Managed Care – PPO | Admitting: Obstetrics & Gynecology

## 2019-09-25 ENCOUNTER — Other Ambulatory Visit: Payer: Self-pay

## 2019-09-25 VITALS — BP 109/74 | HR 91

## 2019-09-25 DIAGNOSIS — O133 Gestational [pregnancy-induced] hypertension without significant proteinuria, third trimester: Secondary | ICD-10-CM

## 2019-09-25 DIAGNOSIS — Z0130 Encounter for examination of blood pressure without abnormal findings: Secondary | ICD-10-CM

## 2019-09-25 NOTE — Progress Notes (Signed)
Subjective:  Erin Whitney is a 29 y.o. female here for BP check.   Hypertension ROS: Denies any HA, SOB, and swelling.    Objective:  BP 109/74   Pulse 91   LMP 12/22/2018 (Exact Date)   Appearance alert, well appearing, and in no distress. General exam BP noted to be well controlled today in office.    Assessment:   Blood Pressure well controlled and patient poorly compliant.   Plan:  Follow up at postpartum visit or as needed.

## 2019-09-26 NOTE — Progress Notes (Signed)
Patient was assessed and managed by nursing staff during this encounter. I have reviewed the chart and agree with the documentation and plan. I have also made any necessary editorial changes.  Verita Schneiders, MD 09/26/2019 11:10 AM

## 2019-10-02 ENCOUNTER — Encounter: Payer: Self-pay | Admitting: Radiology

## 2019-10-19 ENCOUNTER — Telehealth (INDEPENDENT_AMBULATORY_CARE_PROVIDER_SITE_OTHER): Payer: BC Managed Care – PPO | Admitting: Obstetrics and Gynecology

## 2019-10-19 ENCOUNTER — Other Ambulatory Visit: Payer: Self-pay

## 2019-10-19 NOTE — Progress Notes (Signed)
   I connected with V[place]o on 10/19/19 at  1:15 PM EDT by: Mychart video and verified that I am speaking with the correct person using two identifiers.  Patient is located at home and provider is located at [employer] for [employer] at [facility] .     The purpose of this virtual visit is to provide medical care while limiting exposure to the novel coronavirus. I discussed the limitations, risks, security and privacy concerns of performing an evaluation and management service by phone and the availability of in person appointments. I also discussed with the patient that there may be a patient responsible charge related to this service. By engaging in this virtual visit, you consent to the provision of healthcare.  Additionally, you authorize for your insurance to be billed for the services provided during this visit.  The patient expressed understanding and agreed to proceed.  The following staff members participated in the virtual visit:  Crosby Oyster RN, Dr Aletha Halim  Post Partum Visit Note Subjective:   Erin Whitney is a 29 y.o. G1P1001 s/p 4/24 SVD/1st degree and labial (both repaired) at 37.2 gestational weeks with IOL for gHTN.  I have fully reviewed the prenatal and intrapartum course; pregnancy complicated by gestational hypertension.  Postpartum course has been uncomplicated. Baby is doing well. Baby is feeding by breast. Bleeding no bleeding. Bowel function is normal. Bladder function is normal. Patient is not sexually active. Contraception method is oral progesterone-only contraceptive. Postpartum depression screening: negative.   Review of Systems Pertinent items noted in HPI and remainder of comprehensive ROS otherwise negative.   Objective:  There were no vitals filed for this visit. Self-Obtained       Assessment:    Normal postpartum exam.  Plan:  Essential components of care per ACOG recommendations:  1.  Mood and well being: doing well  2. Infant care  and feeding: breast feeding/pumping only  3. Sexuality, contraception and birth spacing: no sexual intercourse yet.   4. Sleep and fatigue: no issues  5. Physical Recovery: no issues   6.  Health Maintenance: pap done 2019. Continue with POPs; options d/w her. Has used COCs in the past.    10 minutes of non-face-to-face time spent with the patient MyChart   Aletha Halim, [place] for [place]

## 2020-02-29 ENCOUNTER — Telehealth: Payer: Self-pay | Admitting: *Deleted

## 2020-02-29 MED ORDER — SULFAMETHOXAZOLE-TRIMETHOPRIM 800-160 MG PO TABS: 1.0000 | ORAL_TABLET | Freq: Two times a day (BID) | ORAL | 0 refills | Status: AC

## 2020-02-29 NOTE — Telephone Encounter (Signed)
Pt called and thinks she has mastitis. She has had it before. Pt wasn't sure if we could treat her or if she needs to be seen. Will talk with provider and call pt back. Per Dr A, okay to call in meds. Pt informed

## 2020-05-01 ENCOUNTER — Telehealth: Payer: Self-pay | Admitting: Radiology

## 2020-05-01 ENCOUNTER — Encounter: Payer: Self-pay | Admitting: Family Medicine

## 2020-05-01 ENCOUNTER — Other Ambulatory Visit: Payer: Self-pay | Admitting: Family Medicine

## 2020-05-01 DIAGNOSIS — Z30011 Encounter for initial prescription of contraceptive pills: Secondary | ICD-10-CM

## 2020-05-01 MED ORDER — LO LOESTRIN FE 1 MG-10 MCG / 10 MCG PO TABS: 1.0000 | ORAL_TABLET | Freq: Every day | ORAL | 3 refills | Status: DC

## 2020-05-01 NOTE — Telephone Encounter (Signed)
Rx sent 

## 2021-03-25 ENCOUNTER — Other Ambulatory Visit (HOSPITAL_COMMUNITY)
Admission: RE | Admit: 2021-03-25 | Discharge: 2021-03-25 | Disposition: A | Discharge status: Home / Self Care | Payer: BC Managed Care – PPO | Source: Ambulatory Visit | Attending: Obstetrics & Gynecology | Admitting: Obstetrics & Gynecology

## 2021-03-25 ENCOUNTER — Encounter: Payer: Self-pay | Admitting: Obstetrics & Gynecology

## 2021-03-25 ENCOUNTER — Other Ambulatory Visit: Payer: Self-pay

## 2021-03-25 ENCOUNTER — Ambulatory Visit (INDEPENDENT_AMBULATORY_CARE_PROVIDER_SITE_OTHER): Payer: BC Managed Care – PPO | Admitting: Obstetrics & Gynecology

## 2021-03-25 VITALS — BP 128/62 | HR 90 | Ht 70.0 in | Wt 217.0 lb

## 2021-03-25 DIAGNOSIS — Z01419 Encounter for gynecological examination (general) (routine) without abnormal findings: Secondary | ICD-10-CM | POA: Diagnosis present

## 2021-03-25 DIAGNOSIS — Z3041 Encounter for surveillance of contraceptive pills: Secondary | ICD-10-CM

## 2021-03-25 MED ORDER — NORGESTIMATE-ETH ESTRADIOL 0.25-35 MG-MCG PO TABS: 1.0000 | ORAL_TABLET | Freq: Every day | ORAL | 11 refills | Status: DC

## 2021-03-25 NOTE — Progress Notes (Signed)
GYNECOLOGY ANNUAL PREVENTATIVE CARE ENCOUNTER NOTE  History:     Erin Whitney is a 30 y.o. G30P1001 female here for a routine annual gynecologic exam.  Current complaints: has shortened cycles 14-21 days long and irregular length of periods 2-5 days since starting on Lo Loestrin earlier this year.  Wants to be switched to [place], has used this in the past without concerns or period irregularities. History of GHTN, no signs of CHTN.    Denies abnormal vaginal bleeding, discharge, pelvic pain, problems with intercourse or other gynecologic concerns.    Gynecologic History Patient's last menstrual period was 03/12/2021 (approximate). Contraception: OCP (estrogen/progesterone) Last Pap: 07/13/2017. Result was normal  Obstetric History OB History  Gravida Para Term Preterm AB Living  1 1 1  0 0 1  SAB IAB Ectopic Multiple Live Births  0 0 0 0 1    # Outcome Date GA Lbr Len/2nd Weight Sex Delivery Anes PTL Lv  1 Term 09/09/19 [redacted]w[redacted]d 13:59 / 01:15 7 lb 5.1 oz (3.32 kg) F Vag-Spont EPI  LIV     Birth Comments: WDL    Past Medical History:  Diagnosis Date   Asthma in adult 01/17/2013   History of gestational hypertension 07/30/2019   Ovarian cyst     Past Surgical History:  Procedure Laterality Date   WISDOM TOOTH EXTRACTION      Current Outpatient Medications on File Prior to Visit  Medication Sig Dispense Refill   albuterol (VENTOLIN HFA) 108 (90 Base) MCG/ACT inhaler Inhale 2 puffs into the lungs every 6 (six) hours as needed for wheezing or shortness of breath. 18 g 1   No current facility-administered medications on file prior to visit.    No Known Allergies  Social History:  reports that she has never smoked. She has never used smokeless tobacco. She reports current alcohol use of about 1.0 standard drink per week. She reports that she does not use drugs.  Family History  Problem Relation Age of Onset   Diabetes Mother    Heart disease Mother    Cancer Mother         bladder   Diabetes Father    Cancer Paternal Grandfather        melonoma   Diabetes Maternal Grandmother    Cancer Maternal Grandmother        cervical    The following portions of the patient's history were reviewed and updated as appropriate: allergies, current medications, past family history, past medical history, past social history, past surgical history and problem list.  Review of Systems Pertinent items noted in HPI and remainder of comprehensive ROS otherwise negative.  Physical Exam:  BP 128/62   Pulse 90   Ht 5\' 10"  (1.778 m)   Wt 217 lb (98.4 kg)   LMP 03/12/2021 (Approximate)   Breastfeeding No   BMI 31.14 kg/m  CONSTITUTIONAL: Well-developed, well-nourished female in no acute distress.  HENT:  Normocephalic, atraumatic, External right and left ear normal.  EYES: Conjunctivae and EOM are normal. Pupils are equal, round, and reactive to light. No scleral icterus.  NECK: Normal range of motion, supple, no masses.  Normal thyroid.  SKIN: Skin is warm and dry. No rash noted. Not diaphoretic. No erythema. No pallor. MUSCULOSKELETAL: Normal range of motion. No tenderness.  No cyanosis, clubbing, or edema. NEUROLOGIC: Alert and oriented to person, place, and time. Normal reflexes, muscle tone coordination.  PSYCHIATRIC: Normal mood and affect. Normal behavior. Normal judgment and thought content. CARDIOVASCULAR: Normal  heart rate noted, regular rhythm RESPIRATORY: Clear to auscultation bilaterally. Effort and breath sounds normal, no problems with respiration noted. BREASTS: Symmetric in size. No masses, tenderness, skin changes, nipple drainage, or lymphadenopathy bilaterally. Performed in the presence of a chaperone. ABDOMEN: Soft, no distention noted.  No tenderness, rebound or guarding.  PELVIC: Normal appearing external genitalia and urethral meatus; normal appearing vaginal mucosa and cervix.  Patient has 1.5 cm wide ectropion, was friable during pap smear, has to  use silver nitrate to ameliorate the bleeding. No abnormal vaginal discharge noted.  Normal uterine size, no other palpable masses, no uterine or adnexal tenderness.  Performed in the presence of a chaperone.   Assessment and Plan:     1. Uses oral contraceptives Changed OCPs to Sprintec, will observe effects. BP check in 6 weeks. - norgestimate-ethinyl estradiol (ORTHO-CYCLEN) 0.25-35 MG-MCG tablet; Take 1 tablet by mouth daily.  Dispense: 28 tablet; Refill: 11  2. Well woman exam with routine gynecological exam - Cytology - PAP  Will follow up results of pap smear and manage accordingly. Informed about cervical ectropion, possibility of bleeding during intercourse (patient denies this for now). Routine preventative health maintenance measures emphasized. Please refer to After Visit Summary for other counseling recommendations.      Verita Schneiders, MD, [place] for [employer], [place]

## 2021-03-25 NOTE — Patient Instructions (Signed)

## 2021-03-27 LAB — CYTOLOGY - PAP
Comment: NEGATIVE
Diagnosis: NEGATIVE
High risk HPV: NEGATIVE

## 2021-04-24 ENCOUNTER — Encounter: Payer: Self-pay | Admitting: Radiology

## 2021-05-05 ENCOUNTER — Other Ambulatory Visit: Payer: Self-pay

## 2021-05-05 ENCOUNTER — Ambulatory Visit (INDEPENDENT_AMBULATORY_CARE_PROVIDER_SITE_OTHER): Payer: BC Managed Care – PPO

## 2021-05-05 VITALS — BP 140/82 | HR 96

## 2021-05-05 DIAGNOSIS — Z3041 Encounter for surveillance of contraceptive pills: Secondary | ICD-10-CM

## 2021-05-05 NOTE — Progress Notes (Signed)
Subjective:  Erin Whitney is a 30 y.o. female here for BP check.  Hypertension ROS: Patient denies any headaches, visual symptoms, RUQ/epigastric pain or other concerning symptoms.  Objective:  BP 140/82    Pulse 96   Appearance alert, well appearing, and in no distress. General exam BP noted to be stable today in office.    Assessment:   Blood Pressure 140/82 pt has no complaints.   Plan:  Will send provider B/P readings  . Patient made aware If any concerns from provider she will be contacted.

## 2021-05-06 NOTE — Progress Notes (Signed)
Patient was assessed and managed by nursing staff during this encounter. I have reviewed the chart and agree with the documentation and plan.   Please see separate notes about counseling for patient given elevated BP and concern about continuing this dosage of estrogen.   Verita Schneiders, MD 05/06/2021 8:28 AM

## 2021-05-21 ENCOUNTER — Encounter: Payer: Self-pay | Admitting: Obstetrics & Gynecology

## 2021-06-03 ENCOUNTER — Telehealth: Payer: BC Managed Care – PPO | Admitting: Obstetrics & Gynecology

## 2021-06-03 ENCOUNTER — Encounter: Payer: Self-pay | Admitting: Obstetrics & Gynecology

## 2021-06-03 ENCOUNTER — Other Ambulatory Visit: Payer: Self-pay

## 2021-06-03 DIAGNOSIS — Z3041 Encounter for surveillance of contraceptive pills: Secondary | ICD-10-CM

## 2021-06-03 DIAGNOSIS — N926 Irregular menstruation, unspecified: Secondary | ICD-10-CM

## 2021-06-03 MED ORDER — NORETHIN ACE-ETH ESTRAD-FE 1-20 MG-MCG(24) PO TABS: 1.0000 | ORAL_TABLET | Freq: Every day | ORAL | 11 refills | Status: DC

## 2021-06-03 NOTE — Progress Notes (Signed)
GYNECOLOGY VIRTUAL VISIT ENCOUNTER NOTE  Provider location: [facility] for [place] at [facility]   Patient location: Work (in school parking lot, safe and private location). Works for [employer].  I connected with [name] on 06/03/21 at  2:30 PM EST by MyChart Video Encounter and verified that I am speaking with the correct person using two identifiers.   I discussed the limitations, risks, security and privacy concerns of performing an evaluation and management service virtually and the availability of in person appointments. I also discussed with the patient that there may be a patient responsible charge related to this service. The patient expressed understanding and agreed to proceed.   History:  Erin Whitney is a 31 y.o. G38P1001 female being followed up today for changes to oral contraceptive pills and irregular bleeding.  Reports having prolonged irregular bleeding when she switched from [place] to [place], wants to try lower estrogen formulation especially given her elevated BP recorded in office (no over diagnosis of HTN).  She is concerned that this irregular bleeding has been ongoing since resumption of her periods postpartum, about 10 months ago.  She denies any abnormal vaginal discharge, pelvic pain or other concerns.       Past Medical History:  Diagnosis Date   Asthma in adult 01/17/2013   History of gestational hypertension 07/30/2019   Ovarian cyst    Past Surgical History:  Procedure Laterality Date   WISDOM TOOTH EXTRACTION     The following portions of the patient's history were reviewed and updated as appropriate: allergies, current medications, past family history, past medical history, past social history, past surgical history and problem list.   Health Maintenance:  Normal pap and negative HRHPV on 03/25/2021.  Review of Systems:  Pertinent items noted in HPI and remainder of comprehensive ROS otherwise  negative.  Physical Exam:   General:  Alert, oriented and cooperative. Patient appears to be in no acute distress.  Mental Status: Normal mood and affect. Normal behavior. Normal judgment and thought content.   Respiratory: Normal respiratory effort, no problems with respiration noted  Rest of physical exam deferred due to type of encounter      Assessment and Plan:     1. Uses oral contraceptives Will try 20 mch formulaiton, but really wanted to try Slynd (not covered by her insurance). Will monitor effect.  Other alternative would be Yaz, has same progestin as Slynd and also 20 mcg.  Will monitor effect. - Norethindrone Acetate-Ethinyl Estrad-FE (LOESTRIN 24 FE) 1-20 MG-MCG(24) tablet; Take 1 tablet by mouth daily.  Dispense: 28 tablet; Refill: 11  2. Irregular menstrual bleeding Will monitor effect of changed OCP, consider Yaz or other alternative if this continues. Will also obtain pelvic ultrasound for further evaluation. If continues, may need further evaluation with labs etc. - Norethindrone Acetate-Ethinyl Estrad-FE (LOESTRIN 24 FE) 1-20 MG-MCG(24) tablet; Take 1 tablet by mouth daily.  Dispense: 28 tablet; Refill: 11 - US PELVIC COMPLETE WITH TRANSVAGINAL; Future     I discussed the assessment and treatment plan with the patient. The patient was provided an opportunity to ask questions and all were answered. The patient agreed with the plan and demonstrated an understanding of the instructions.   The patient was advised to call back or seek an in-person evaluation/go to the ED if the symptoms worsen or if the condition fails to improve as anticipated.  I provided 20 minutes of face-to-face time during this encounter.   Verita Schneiders,  MD [facility] for [place]

## 2021-06-10 ENCOUNTER — Ambulatory Visit
Admission: RE | Admit: 2021-06-10 | Discharge: 2021-06-10 | Disposition: A | Discharge status: Home / Self Care | Payer: BC Managed Care – PPO | Source: Ambulatory Visit | Attending: Obstetrics & Gynecology | Admitting: Obstetrics & Gynecology

## 2021-06-10 ENCOUNTER — Other Ambulatory Visit: Payer: Self-pay

## 2021-06-10 DIAGNOSIS — N926 Irregular menstruation, unspecified: Secondary | ICD-10-CM | POA: Diagnosis present

## 2021-08-05 ENCOUNTER — Telehealth: Payer: BC Managed Care – PPO | Admitting: Obstetrics & Gynecology

## 2021-08-05 ENCOUNTER — Ambulatory Visit: Payer: BC Managed Care – PPO | Admitting: Adult Health

## 2021-09-02 ENCOUNTER — Encounter: Payer: Self-pay | Admitting: Family

## 2021-09-02 ENCOUNTER — Ambulatory Visit: Payer: BC Managed Care – PPO | Admitting: Family

## 2021-09-02 VITALS — BP 140/72 | HR 79 | Temp 98.3°F | Resp 16 | Ht 70.0 in | Wt 209.2 lb

## 2021-09-02 DIAGNOSIS — Z0000 Encounter for general adult medical examination without abnormal findings: Secondary | ICD-10-CM | POA: Insufficient documentation

## 2021-09-02 DIAGNOSIS — Z1283 Encounter for screening for malignant neoplasm of skin: Secondary | ICD-10-CM | POA: Diagnosis not present

## 2021-09-02 DIAGNOSIS — J45990 Exercise induced bronchospasm: Secondary | ICD-10-CM | POA: Diagnosis not present

## 2021-09-02 DIAGNOSIS — Z13220 Encounter for screening for lipoid disorders: Secondary | ICD-10-CM | POA: Insufficient documentation

## 2021-09-02 DIAGNOSIS — L219 Seborrheic dermatitis, unspecified: Secondary | ICD-10-CM

## 2021-09-02 MED ORDER — KETOCONAZOLE 2 % EX SHAM: 1.0000 "application " | MEDICATED_SHAMPOO | CUTANEOUS | 0 refills | Status: AC

## 2021-09-02 NOTE — Assessment & Plan Note (Signed)
Referral placed for dermatologist for annual skin screening (fmh melanoma) ?

## 2021-09-02 NOTE — Assessment & Plan Note (Signed)
On physical exam scalp with dandruff, suspected seb dermatitis ?rx ketoconazole shampoo 2%  ?selsum blue otc ?Consult with derm if no improvement ?

## 2021-09-02 NOTE — Assessment & Plan Note (Signed)
Lipid panel ordered pending results.   

## 2021-09-02 NOTE — Assessment & Plan Note (Signed)
Albuterol up to date, use as needed ?

## 2021-09-02 NOTE — Assessment & Plan Note (Signed)
Labs today , ordered pending results. ?Patient Counseling(The following topics were reviewed): ? Preventative care handout given to pt  ?Health maintenance and immunizations reviewed. Please refer to Health maintenance section. ?Pt advised on safe sex, wearing seatbelts in car, and proper nutrition ?labwork ordered today for annual ?Dental health: Discussed importance of regular tooth brushing, flossing, and dental visits. ? ? ?

## 2021-09-02 NOTE — Patient Instructions (Addendum)
A referral was placed today for dermatology.  ?Please let us know if you have not heard back within 2 weeks about the referral. ? ?Welcome to our clinic, I am happy to have you as my new patient. I am excited to continue on this healthcare journey with you. ? ?Please keep in mind ?Any my chart messages you send have p to a three business day turnaround for a response.  ?Phone calls may have up to a one day business turnaround for a  response.  ? ?If you need a medication refill I recommend you request it through the pharmacy as this is easiest for [place] rather than sending a message and or phone call.  ? ? ?Due to recent changes in healthcare laws, you may see results of your imaging and/or laboratory studies on MyChart before I have had a chance to review them.  I understand that in some cases there may be results that are confusing or concerning to you. Please understand that not all results are received at the same time and often I may need to interpret multiple results in order to provide you with the best plan of care or course of treatment. Therefore, I ask that you please give me 2 business days to thoroughly review all your results before contacting my office for clarification. Should we see a critical lab result, you will be contacted sooner.  ? ?It was a pleasure seeing you today! Please do not hesitate to reach out with any questions and or concerns. ? ?Regards,  ? ?Izeyah Deike ?FNP-C ? ? ?

## 2021-09-02 NOTE — Progress Notes (Signed)
? ?New Patient Office Visit ? ?Subjective:  ?Patient ID: Erin Whitney, female    DOB: Apr 05, 1991  Age: 31 y.o. MRN: [id number] ? ?CC:  ?Chief Complaint  ?Patient presents with  ? Establish Care  ? ? ?HPI ?Erin Whitney is here to establish care as a new patient. ? ?Prior provider PPI:RJJOA Kary Kos, however only saw him once in the past otherwise has been seeing GYN for primary concerns.  ?Pt is without acute concerns.  ? ?Pap: 03/25/21, negative.  ? ?chronic concerns: ? ?On birth control, OCPs, doing well. On her third month currently, doing well. Regular cycles now which was the goal, had irregular cycles post breastfeeding. No longer breastfeeding. ? ?Exercise induced asthma: albuterol inhaler up to date, only with exercise. Hasn't needed to use in some time.  ? ?Past Medical History:  ?Diagnosis Date  ? Asthma in adult 01/17/2013  ? History of gestational hypertension 07/30/2019  ? Ovarian cyst   ? ? ?Past Surgical History:  ?Procedure Laterality Date  ? WISDOM TOOTH EXTRACTION    ? ? ?Family History  ?Problem Relation Age of Onset  ? Diabetes Mother   ? Heart disease Mother   ? Bladder Cancer Mother   ? Diabetes Father   ? Diabetes Maternal Grandmother   ? Cervical cancer Maternal Grandmother   ? Melanoma Paternal Grandfather   ? ? ?Social History  ? ?Socioeconomic History  ? Marital status: Married  ?  Spouse name: Not on file  ? Number of children: 2  ? Years of education: 4  ? Highest education level: Not on file  ?Occupational History  ? Occupation: Pharmacist, hospital  ?  Comment: Kindergarten-[employer]  ?  Employer: [employer]  ?Tobacco Use  ? Smoking status: Never  ? Smokeless tobacco: Never  ?Vaping Use  ? Vaping Use: Never used  ?Substance and Sexual Activity  ? Alcohol use: Yes  ?  Alcohol/week: 1.0 standard drink  ?  Types: 1 Cans of beer per week  ?  Comment: social  ? Drug use: No  ? Sexual activity: Yes  ?  Partners: Male  ?  Birth control/protection: OCP  ?Other Topics Concern  ? Not on file   ?Social History Narrative  ? Aline Brochure, daughter, two years ago 4/18  ? ?Social Determinants of Health  ? ?Financial Resource Strain: Not on file  ?Food Insecurity: Not on file  ?Transportation Needs: Not on file  ?Physical Activity: Not on file  ?Stress: Not on file  ?Social Connections: Not on file  ?Intimate Partner Violence: Not on file  ? ? ?Outpatient Medications Prior to Visit  ?Medication Sig Dispense Refill  ? albuterol (VENTOLIN HFA) 108 (90 Base) MCG/ACT inhaler Inhale 2 puffs into the lungs every 6 (six) hours as needed for wheezing or shortness of breath. 18 g 1  ? Multiple Vitamin (MULTIVITAMIN) capsule Take 1 capsule by mouth daily.    ? Norethindrone Acetate-Ethinyl Estrad-FE (LOESTRIN 24 FE) 1-20 MG-MCG(24) tablet Take 1 tablet by mouth daily. 28 tablet 11  ? Saccharomyces boulardii (PROBIOTIC) 250 MG CAPS Take by mouth.    ? ?No facility-administered medications prior to visit.  ? ? ?No Known Allergies ? ?ROS ?Review of Systems  ?Constitutional:  Negative for chills, fatigue, fever and unexpected weight change.  ?Eyes:  Negative for visual disturbance.  ?Respiratory:  Negative for shortness of breath.   ?Cardiovascular:  Negative for chest pain.  ?Gastrointestinal:  Negative for abdominal pain.  ?Genitourinary:  Negative for difficulty urinating.  ?  Skin:  Negative for rash.  ?Neurological:  Negative for dizziness and headaches.  ? ? ? ?  ?Objective:  ?  ?Physical Exam ?Vitals reviewed.  ?Constitutional:   ?   General: She is not in acute distress. ?   Appearance: Normal appearance. She is not ill-appearing or toxic-appearing.  ?HENT:  ?   Head: Normocephalic.  ?   Comments: Scalp with flaky skin and dryness with red raised patches scattered ?   Right Ear: Tympanic membrane normal.  ?   Left Ear: Tympanic membrane normal.  ?   Mouth/Throat:  ?   Mouth: Mucous membranes are moist.  ?   Pharynx: No pharyngeal swelling.  ?   Tonsils: No tonsillar exudate.  ?Eyes:  ?   Extraocular Movements: Extraocular  movements intact.  ?   Conjunctiva/sclera: Conjunctivae normal.  ?   Pupils: Pupils are equal, round, and reactive to light.  ?Neck:  ?   Thyroid: No thyroid mass.  ?Cardiovascular:  ?   Rate and Rhythm: Normal rate and regular rhythm.  ?Pulmonary:  ?   Effort: Pulmonary effort is normal.  ?   Breath sounds: Normal breath sounds.  ?Abdominal:  ?   General: Abdomen is flat.  ?Musculoskeletal:     ?   General: Normal range of motion.  ?Lymphadenopathy:  ?   Cervical:  ?   Right cervical: No superficial cervical adenopathy. ?   Left cervical: No superficial cervical adenopathy.  ?Skin: ?   General: Skin is warm.  ?   Capillary Refill: Capillary refill takes less than 2 seconds.  ?Neurological:  ?   General: No focal deficit present.  ?   Mental Status: She is alert and oriented to person, place, and time.  ?Psychiatric:     ?   Mood and Affect: Mood normal.     ?   Behavior: Behavior normal.     ?   Thought Content: Thought content normal.     ?   Judgment: Judgment normal.  ? ? ? ? ?BP 140/72   Pulse 79   Temp 98.3 ?F (36.8 ?C)   Resp 16   Ht '5\' 10"'$  (1.778 m)   Wt 209 lb 3 oz (94.9 kg)   LMP 08/12/2021 (Approximate)   SpO2 99%   Breastfeeding No   BMI 30.02 kg/m?  ?Wt Readings from Last 3 Encounters:  ?09/02/21 209 lb 3 oz (94.9 kg)  ?03/25/21 217 lb (98.4 kg)  ?09/07/19 229 lb 4.8 oz (104 kg)  ? ? ? ?There are no preventive care reminders to display for this patient. ? ? ?There are no preventive care reminders to display for this patient. ? ?No results found for: TSH ?Lab Results  ?Component Value Date  ? WBC 17.1 (H) 09/09/2019  ? HGB 12.5 09/09/2019  ? HCT 36.7 09/09/2019  ? MCV 93.9 09/09/2019  ? PLT 202 09/09/2019  ? ?Lab Results  ?Component Value Date  ? NA 136 09/07/2019  ? K 3.8 09/07/2019  ? CO2 19 (L) 09/07/2019  ? GLUCOSE 120 (H) 09/07/2019  ? BUN 7 09/07/2019  ? CREATININE 0.81 09/07/2019  ? BILITOT 0.3 09/07/2019  ? ALKPHOS 128 (H) 09/07/2019  ? AST 40 09/07/2019  ? ALT 33 09/07/2019  ? PROT 5.4  (L) 09/07/2019  ? ALBUMIN 2.6 (L) 09/07/2019  ? CALCIUM 8.9 09/07/2019  ? ANIONGAP 11 09/07/2019  ? ?No results found for: CHOL ?No results found for: HDL ?No results found for: [place] ?No results found  for: TRIG ?No results found for: CHOLHDL ?Lab Results  ?Component Value Date  ? HGBA1C 4.6 (L) 04/04/2019  ? ? ?  ?Assessment & Plan:  ? ?Problem List Items Addressed This Visit   ? ?  ? Respiratory  ? Exercise-induced asthma - Primary  ?  Albuterol up to date, use as needed ? ?  ?  ?  ? Musculoskeletal and Integument  ? Seborrheic dermatitis of scalp  ?  On physical exam scalp with dandruff, suspected seb dermatitis ?rx ketoconazole shampoo 2%  ?selsum blue otc ?Consult with derm if no improvement ? ?  ?  ? Relevant Medications  ? ketoconazole (NIZORAL) 2 % shampoo (Start on 09/04/2021)  ?  ? Other  ? Encounter for general adult medical examination without abnormal findings  ?  Labs today , ordered pending results. ?Patient Counseling(The following topics were reviewed): ? Preventative care handout given to pt  ?Health maintenance and immunizations reviewed. Please refer to Health maintenance section. ?Pt advised on safe sex, wearing seatbelts in car, and proper nutrition ?labwork ordered today for annual ?Dental health: Discussed importance of regular tooth brushing, flossing, and dental visits. ? ? ?  ?  ? Relevant Orders  ? Comprehensive metabolic panel  ? Lipid panel  ? CBC with Differential/Platelet  ? Screening for lipoid disorders  ?  Lipid panel ordered pending results.  ? ? ?  ?  ? Relevant Orders  ? Lipid panel  ? Screening for malignant neoplasm of skin  ?  Referral placed for dermatologist for annual skin screening (fmh melanoma) ? ?  ?  ? Relevant Orders  ? Ambulatory referral to Dermatology  ? ? ?Meds ordered this encounter  ?Medications  ? ketoconazole (NIZORAL) 2 % shampoo  ?  Sig: Apply 1 application. topically 2 (two) times a week.  ?  Dispense:  40 mL  ?  Refill:  0  ?  Order Specific Question:    Supervising Provider  ?  Answer:   BEDSOLE, AMY E [[id number]]  ? ? ?Follow-up: Return in about 1 year (around 09/03/2022) for annually otherwise prn.  ? ? ?Eugenia Pancoast, FNP ?

## 2021-09-03 ENCOUNTER — Other Ambulatory Visit: Payer: Self-pay | Admitting: Family

## 2021-09-03 ENCOUNTER — Other Ambulatory Visit (INDEPENDENT_AMBULATORY_CARE_PROVIDER_SITE_OTHER): Payer: BC Managed Care – PPO

## 2021-09-03 DIAGNOSIS — R7989 Other specified abnormal findings of blood chemistry: Secondary | ICD-10-CM | POA: Diagnosis not present

## 2021-09-03 DIAGNOSIS — R945 Abnormal results of liver function studies: Secondary | ICD-10-CM

## 2021-09-03 LAB — COMPREHENSIVE METABOLIC PANEL
ALT: 257 U/L — ABNORMAL HIGH (ref 0–35)
AST: 176 U/L — ABNORMAL HIGH (ref 0–37)
Albumin: 4.6 g/dL (ref 3.5–5.2)
Alkaline Phosphatase: 67 U/L (ref 39–117)
BUN: 11 mg/dL (ref 6–23)
CO2: 26 mEq/L (ref 19–32)
Calcium: 9.5 mg/dL (ref 8.4–10.5)
Chloride: 101 mEq/L (ref 96–112)
Creatinine, Ser: 0.93 mg/dL (ref 0.40–1.20)
GFR: 82.42 mL/min (ref >60.00)
Glucose, Bld: 95 mg/dL (ref 70–99)
Potassium: 3.8 mEq/L (ref 3.5–5.1)
Sodium: 138 mEq/L (ref 135–145)
Total Bilirubin: 0.5 mg/dL (ref 0.2–1.2)
Total Protein: 7.9 g/dL (ref 6.0–8.3)

## 2021-09-03 LAB — CBC WITH DIFFERENTIAL/PLATELET
Basophils Absolute: 0.1 10*3/uL (ref 0.0–0.1)
Basophils Relative: 1.1 % (ref 0.0–3.0)
Eosinophils Absolute: 0.1 10*3/uL (ref 0.0–0.7)
Eosinophils Relative: 1.4 % (ref 0.0–5.0)
HCT: 41.9 % (ref 36.0–46.0)
Hemoglobin: 14.2 g/dL (ref 12.0–15.0)
Lymphocytes Relative: 41.7 % (ref 12.0–46.0)
Lymphs Abs: 2.9 10*3/uL (ref 0.7–4.0)
MCHC: 33.9 g/dL (ref 30.0–36.0)
MCV: 91.6 fl (ref 78.0–100.0)
Monocytes Absolute: 0.6 10*3/uL (ref 0.1–1.0)
Monocytes Relative: 8.7 % (ref 3.0–12.0)
Neutro Abs: 3.3 10*3/uL (ref 1.4–7.7)
Neutrophils Relative %: 47.1 % (ref 43.0–77.0)
Platelets: 212 10*3/uL (ref 150.0–400.0)
RBC: 4.58 Mil/uL (ref 3.87–5.11)
RDW: 12 % (ref 11.5–15.5)
WBC: 7 10*3/uL (ref 4.0–10.5)

## 2021-09-03 LAB — LIPID PANEL
Cholesterol: 166 mg/dL (ref 0–200)
HDL: 49.5 mg/dL (ref >39.00)
LDL Cholesterol: 96 mg/dL (ref 0–99)
NonHDL: 116.54
Total CHOL/HDL Ratio: 3
Triglycerides: 105 mg/dL (ref 0.0–149.0)
VLDL: 21 mg/dL (ref 0.0–40.0)

## 2021-09-03 LAB — AMYLASE: Amylase: 42 U/L (ref 27–131)

## 2021-09-03 LAB — LIPASE: Lipase: 38 U/L (ref 11.0–59.0)

## 2021-09-03 NOTE — Progress Notes (Signed)
Coralyn Mark are we able to add a hepatitis panel amylase and lipase?  I did put the orders in if able.  If not let me know so I can let the patient know.

## 2021-09-03 NOTE — Progress Notes (Signed)
Called and spoke with the patient she denies any recent alcohol intake ibuprofen Tylenol and/or herbal supplements.  She denies any right upper quadrant abdominal pain.  She denies any burping or GI symptoms.  She denies any nausea. ? ?I have advised patient to make a lab only appointment to repeat in 2 weeks. ?I am working on adding a hepatitis panel and ordering an ultrasound of the abdomen to visualize the liver. ? ?She Does state that her mom has some rare liver cirrhosis, possibly NASH.

## 2021-09-04 ENCOUNTER — Ambulatory Visit
Admission: RE | Admit: 2021-09-04 | Discharge: 2021-09-04 | Disposition: A | Discharge status: Home / Self Care | Payer: BC Managed Care – PPO | Source: Ambulatory Visit | Attending: Family | Admitting: Family

## 2021-09-04 ENCOUNTER — Other Ambulatory Visit: Payer: Self-pay | Admitting: Family

## 2021-09-04 ENCOUNTER — Encounter: Payer: Self-pay | Admitting: Family

## 2021-09-04 DIAGNOSIS — R7989 Other specified abnormal findings of blood chemistry: Secondary | ICD-10-CM | POA: Insufficient documentation

## 2021-09-05 ENCOUNTER — Other Ambulatory Visit (INDEPENDENT_AMBULATORY_CARE_PROVIDER_SITE_OTHER): Payer: BC Managed Care – PPO

## 2021-09-05 DIAGNOSIS — R7989 Other specified abnormal findings of blood chemistry: Secondary | ICD-10-CM | POA: Diagnosis not present

## 2021-09-05 NOTE — Addendum Note (Signed)
Addended by: Carter Kitten on: 09/05/2021 08:54 AM ? ? Modules accepted: Orders ? ?

## 2021-09-05 NOTE — Addendum Note (Signed)
Addended by: Carter Kitten on: 09/05/2021 08:53 AM ? ? Modules accepted: Orders ? ?

## 2021-09-08 ENCOUNTER — Encounter: Payer: Self-pay | Admitting: Family

## 2021-09-08 DIAGNOSIS — R7989 Other specified abnormal findings of blood chemistry: Secondary | ICD-10-CM

## 2021-09-08 NOTE — Progress Notes (Signed)
Pending results of hepatitis panel

## 2021-09-09 ENCOUNTER — Encounter: Payer: Self-pay | Admitting: Physician Assistant

## 2021-09-09 NOTE — Telephone Encounter (Signed)
Responded to by other means

## 2021-09-10 LAB — HEPATITIS PANEL, ACUTE
Hep A IgM: NONREACTIVE
Hep B C IgM: NONREACTIVE
Hepatitis B Surface Ag: NONREACTIVE
Hepatitis C Ab: NONREACTIVE
SIGNAL TO CUT-OFF: 0.03 (ref <1.00)

## 2021-09-10 LAB — HEPATIC FUNCTION PANEL
AG Ratio: 1.6 (calc) (ref 1.0–2.5)
ALT: 333 U/L — ABNORMAL HIGH (ref 6–29)
AST: 232 U/L — ABNORMAL HIGH (ref 10–30)
Albumin: 4.2 g/dL (ref 3.6–5.1)
Alkaline phosphatase (APISO): 66 U/L (ref 31–125)
Bilirubin, Direct: 0.1 mg/dL (ref 0.0–0.2)
Globulin: 2.7 g/dL (calc) (ref 1.9–3.7)
Indirect Bilirubin: 0.6 mg/dL (calc) (ref 0.2–1.2)
Total Bilirubin: 0.7 mg/dL (ref 0.2–1.2)
Total Protein: 6.9 g/dL (ref 6.1–8.1)

## 2021-09-17 ENCOUNTER — Other Ambulatory Visit (INDEPENDENT_AMBULATORY_CARE_PROVIDER_SITE_OTHER): Payer: BC Managed Care – PPO

## 2021-09-17 DIAGNOSIS — R7989 Other specified abnormal findings of blood chemistry: Secondary | ICD-10-CM | POA: Diagnosis not present

## 2021-09-17 LAB — HEPATIC FUNCTION PANEL
ALT: 158 U/L — ABNORMAL HIGH (ref 0–35)
AST: 141 U/L — ABNORMAL HIGH (ref 0–37)
Albumin: 4.8 g/dL (ref 3.5–5.2)
Alkaline Phosphatase: 78 U/L (ref 39–117)
Bilirubin, Direct: 0.2 mg/dL (ref 0.0–0.3)
Total Bilirubin: 0.7 mg/dL (ref 0.2–1.2)
Total Protein: 8.2 g/dL (ref 6.0–8.3)

## 2021-09-20 ENCOUNTER — Encounter: Payer: Self-pay | Admitting: Family

## 2021-09-24 NOTE — Progress Notes (Signed)
? ? ? ?09/25/2021 ?Erin Whitney ?[id number] ?[birth year] ? ?Referring provider: Eugenia Pancoast, FNP ?Primary GI doctor: Dr. Silverio Decamp ? ?ASSESSMENT AND PLAN:  ? ?Elevated liver function tests ?Suspected non-alcoholic fatty liver disease by history and ultrasound with family history of Cirrhosis secondary to NASH likely worsened by new estrogen/BCP ?Must exclude other chronic causes of hepatocellular inflammation that can mimic fatty liver on ultrasound. ?- RUQ AB [place] with hepatic steatosis ?- Has had negative hepatitis panel  ?- Labs to include: iron, ferritin, TIBC,  IgG, ANA, Antismooth muscle antibody, celiac, thyroid, alpha-one-antitrypsin level ?--Continue to work on risk factor modification including diet exercise and control of risk factors including blood sugars. Avoid ETOH.  ?- can add on vitamin E '800mg'$  BID for non diabetic NASH.  ? ?Fatty liver ?--Continue to work on risk factor modification including diet exercise and control of risk factors including blood sugars. ?- monitor LFTs q 6 months.  ? ? ?Patient Care Team: ?Eugenia Pancoast, FNP as PCP - General (Family Medicine) ? ?HISTORY OF PRESENT ILLNESS: ?31 y.o. female referred by Eugenia Pancoast, FNP, with a past medical history of asthma and others listed below presents for evaluation of elevated liver functions.  ? ?09/05/2021 AST 232, ALT 333 ?09/17/2021: AST 141 09/17/2021: ALT 158  ?09/17/2021: Alkaline Phosphatase 78  ?Normal platelets. ?She body mass index is 28.68 kg/m?. ?AB [place]: 09/04/2021 no gallstones or wall thickening, negative Murphy's, no ductal dilatation, slight increased echogenicity in the liver suggest fatty infiltration without focal abnormalities.  Spleen unremarkable, pancreas normal ? ?Patient appears to have elevated liver functions since 09/02/2021. ?Prior to this she has had some minor alkaline phosphatase elevations back in 2021 but otherwise unremarkable.  ?Mom with Cirrhosis secondary to NASH, grandmother with fatty liver.  ? Started  on BCP 06/2021 due to irregular menses.  ?The patient denies issues with jaundice, scleral icterus, dark urine, clay colored stool.  ?She denies Abdominal distention, LE edema. Denies generalized pruritus. ?She denies confusion.  ?Denies GERD, nausea, vomiting, changes in stool, hematochezia.  ?She reports ETOH use, weekends only, 2-3 a weekend, has not had any since 04/18. ?Patient is not a smoker.  ?Admits to tylenol minimal tylenol use, less than 3 x a week.  ?She denies OTC meds other than MVIT and probiotic.  ?She is not on a statin.   ?She is on birth control/estrogen switched in Feb due to irregular cycles post breast feeding. She stopped the BCP on 04/18.  ?She denies tattoos, high risk sexual behavior, IV drug use, and blood transfusion history ? ?External labs and notes reviewed this visit:  ?09/02/2021  ?HGB 14.2 MCV 91.6 without evidence of anemia ?WBC 7.0 Platelets 212.0 ?09/05/2021 AST 232, ALT 333-->09/17/2021  AST 141 ALT 158 ?Alkphos 78 TBili 0.7 ?Negative acute hepatitis panel ? ? ? ?Current Medications:  ? ?Current Outpatient Medications (Endocrine & Metabolic):  ?  Norethindrone Acetate-Ethinyl Estrad-FE (LOESTRIN 24 FE) 1-20 MG-MCG(24) tablet, Take 1 tablet by mouth daily. (Patient not taking: Reported on 09/25/2021) ? ? ?Current Outpatient Medications (Respiratory):  ?  albuterol (VENTOLIN HFA) 108 (90 Base) MCG/ACT inhaler, Inhale 2 puffs into the lungs every 6 (six) hours as needed for wheezing or shortness of breath. ? ? ? ?Current Outpatient Medications (Other):  ?  ketoconazole (NIZORAL) 2 % shampoo, Apply 1 application. topically 2 (two) times a week. ?  Multiple Vitamin (MULTIVITAMIN) capsule, Take 1 capsule by mouth daily. ?  Saccharomyces boulardii (PROBIOTIC) 250 MG CAPS, Take by mouth. ? ?Medical History:  ?  Past Medical History:  ?Diagnosis Date  ? Asthma in adult 01/17/2013  ? History of gestational hypertension 07/30/2019  ? Ovarian cyst   ? ?Allergies: No Known Allergies  ? ?Surgical  History:  ?She  has a past surgical history that includes Wisdom tooth extraction. ?Family History:  ?Her family history includes Bladder Cancer in her mother; Cervical cancer in her maternal grandmother; Diabetes in her father, maternal grandmother, and mother; Heart disease in her mother; Liver disease in her mother; Melanoma in her paternal grandfather. ?Social History:  ? reports that she has never smoked. She has never used smokeless tobacco. She reports current alcohol use of about 1.0 standard drink per week. She reports that she does not use drugs. ? ?REVIEW OF SYSTEMS  : All other systems reviewed and negative except where noted in the History of Present Illness. ? ? ?PHYSICAL EXAM: ?BP 120/70   Ht '5\' 11"'$  (1.803 m)   Wt 205 lb 9.6 oz (93.3 kg)   BMI 28.68 kg/m?  ?General :  Alert, well developed female in no acute distress ?Head:  Normocephalic and atraumatic. ?Eyes :  sclerae anicteric,conjunctive pink  ?Heart:  regular rate and rhythm ?Pulm:  Clear anteriorly; no wheezing ?Abdomen:   Soft, Obese AB, skin exam normal, Normal bowel sounds.  no  tenderness . Without guarding and Without rebound, without hepatomegaly. no  fluid wave, no  shifting dullness.  ?Extremities:   Without edema. ?Msk:  Symmetrical without gross deformities. Peripheral pulses intact.  ?Neurologic: Alert and  oriented x4;  grossly normal neurologically. without asterixis or clonus.  ?Skin:   without jaundice.  ?Psychiatric:  Demonstrates good judgement and reason without abnormal affect or behaviors, some anxiety and slightly tearful.  ? ? ?Vladimir Crofts, PA-C ?8:56 AM ? ? ?

## 2021-09-25 ENCOUNTER — Other Ambulatory Visit (INDEPENDENT_AMBULATORY_CARE_PROVIDER_SITE_OTHER): Payer: BC Managed Care – PPO

## 2021-09-25 ENCOUNTER — Ambulatory Visit: Payer: BC Managed Care – PPO | Admitting: Physician Assistant

## 2021-09-25 ENCOUNTER — Encounter: Payer: Self-pay | Admitting: Physician Assistant

## 2021-09-25 VITALS — BP 120/70 | Ht 71.0 in | Wt 205.6 lb

## 2021-09-25 DIAGNOSIS — R7989 Other specified abnormal findings of blood chemistry: Secondary | ICD-10-CM

## 2021-09-25 DIAGNOSIS — K760 Fatty (change of) liver, not elsewhere classified: Secondary | ICD-10-CM | POA: Diagnosis not present

## 2021-09-25 LAB — CBC WITH DIFFERENTIAL/PLATELET
Basophils Absolute: 0.1 10*3/uL (ref 0.0–0.1)
Basophils Relative: 0.7 % (ref 0.0–3.0)
Eosinophils Absolute: 0.2 10*3/uL (ref 0.0–0.7)
Eosinophils Relative: 2.4 % (ref 0.0–5.0)
HCT: 42.1 % (ref 36.0–46.0)
Hemoglobin: 14.1 g/dL (ref 12.0–15.0)
Lymphocytes Relative: 29.2 % (ref 12.0–46.0)
Lymphs Abs: 2.6 10*3/uL (ref 0.7–4.0)
MCHC: 33.6 g/dL (ref 30.0–36.0)
MCV: 91.8 fl (ref 78.0–100.0)
Monocytes Absolute: 1 10*3/uL (ref 0.1–1.0)
Monocytes Relative: 11.6 % (ref 3.0–12.0)
Neutro Abs: 4.9 10*3/uL (ref 1.4–7.7)
Neutrophils Relative %: 56.1 % (ref 43.0–77.0)
Platelets: 197 10*3/uL (ref 150.0–400.0)
RBC: 4.59 Mil/uL (ref 3.87–5.11)
RDW: 12.1 % (ref 11.5–15.5)
WBC: 8.8 10*3/uL (ref 4.0–10.5)

## 2021-09-25 LAB — COMPREHENSIVE METABOLIC PANEL
ALT: 111 U/L — ABNORMAL HIGH (ref 0–35)
AST: 92 U/L — ABNORMAL HIGH (ref 0–37)
Albumin: 4.6 g/dL (ref 3.5–5.2)
Alkaline Phosphatase: 71 U/L (ref 39–117)
BUN: 10 mg/dL (ref 6–23)
CO2: 25 mEq/L (ref 19–32)
Calcium: 9.6 mg/dL (ref 8.4–10.5)
Chloride: 103 mEq/L (ref 96–112)
Creatinine, Ser: 1 mg/dL (ref 0.40–1.20)
GFR: 75.51 mL/min (ref >60.00)
Glucose, Bld: 85 mg/dL (ref 70–99)
Potassium: 4 mEq/L (ref 3.5–5.1)
Sodium: 137 mEq/L (ref 135–145)
Total Bilirubin: 0.7 mg/dL (ref 0.2–1.2)
Total Protein: 7.8 g/dL (ref 6.0–8.3)

## 2021-09-25 LAB — TSH: TSH: 1.12 u[IU]/mL (ref 0.35–5.50)

## 2021-09-25 LAB — PROTIME-INR
INR: 1 ratio (ref 0.8–1.0)
Prothrombin Time: 11 s (ref 9.6–13.1)

## 2021-09-25 LAB — FERRITIN: Ferritin: 81.2 ng/mL (ref 10.0–291.0)

## 2021-09-25 NOTE — Patient Instructions (Addendum)
If you are age 31 or younger, your body mass index should be between 19-25. Your Body mass index is 28.68 kg/m?Marland Kitchen If this is out of the aformentioned range listed, please consider follow up with your Primary Care Provider.  ?________________________________________________________ ? ?The [place] [facility] providers would like to encourage you to use [facility] to communicate with providers for non-urgent requests or questions.  Due to long hold times on the telephone, sending your provider a message by [facility] may be a faster and more efficient way to get a response.  Please allow 48 business hours for a response.  Please remember that this is for non-urgent requests.  ?_______________________________________________________ ? ?Your provider has requested that you go to the basement level for lab work before leaving today. Press "B" on the elevator. The lab is located at the first door on the left as you exit the elevator. ? ?Due to recent changes in healthcare laws, you may see the results of your imaging and laboratory studies on MyChart before your provider has had a chance to review them.  We understand that in some cases there may be results that are confusing or concerning to you. Not all laboratory results come back in the same time frame and the provider may be waiting for multiple results in order to interpret others.  Please give [place] 48 hours in order for your provider to thoroughly review all the results before contacting the office for clarification of your results.  ? ?Fatty liver or Nonalcoholic fatty liver disease (NASH)  ?Now the leading cause of liver failure in the [place].  ?It is normally from such risk factors as obesity, diabetes, insulin resistance, high cholesterol, or metabolic syndrome.  ?The only definitive therapy is weight loss and exercise.  ?KEEP UP THE GOOD WORK!! ? ? ?Suggest walking 20-30 mins daily.  ?Decreasing carbohydrates, increasing veggies.  ?Can take Vitamin E '800mg'$  once or twice  a day.  ? ? ?Fatty Liver ?Fatty liver is the accumulation of fat in liver cells. It is also called hepatosteatosis or steatohepatitis. It is normal for your liver to contain some fat. If fat is more than 5 to 10% of your liver's weight, you have fatty liver.  ?There are often no symptoms (problems) for years while damage is still occurring. People often learn about their fatty liver when they have medical tests for other reasons. Fat can damage your liver for years or even decades without causing problems. When it becomes severe, it can cause fatigue, weight loss, weakness, and confusion. ?This makes you more likely to develop more serious liver problems. The liver is the largest organ in the body. It does a lot of work and often gives no warning signs when it is sick until late in a disease. ?The liver has many important jobs including: ?Breaking down foods. ?Storing vitamins, iron, and other minerals. ?Making proteins. ?Making bile for food digestion. ?Breaking down many products including medications, alcohol and some poisons. ? ?PROGNOSIS  ?Fatty liver may cause no damage or it can lead to an inflammation of the liver. This is, called steatohepatitis.  Over time the liver may become scarred and hardened. This condition is called cirrhosis. Cirrhosis is serious and may lead to liver failure or cancer. NASH is one of the leading causes of cirrhosis. About 10-20% of Americans have fatty liver and a smaller 2-5% has NASH. ? ?TREATMENT  ?Weight loss, fat restriction, and exercise in overweight patients produces inconsistent results but is worth trying. ?Good control  of diabetes may reduce fatty liver. ?Eat a balanced, healthy diet. ?Increase your physical activity. ?There are no medical or surgical treatments for a fatty liver or NASH, but improving your diet and increasing your exercise may help prevent or reverse some of the damage. ? ?Follow up will be determined after lab results are in.  ? ?Thank you for  entrusting me with your care and choosing [facility]. ? ?Vicie Mutters, PA-C ? ?

## 2021-09-26 LAB — IRON AND TIBC
Iron Saturation: 34 % (ref 15–55)
Iron: 137 ug/dL (ref 27–159)
Total Iron Binding Capacity: 399 ug/dL (ref 250–450)
UIBC: 262 ug/dL (ref 131–425)

## 2021-10-02 LAB — ANA: Anti Nuclear Antibody (ANA): NEGATIVE

## 2021-10-02 LAB — TISSUE TRANSGLUTAMINASE, IGA: (tTG) Ab, IgA: <1 U/mL

## 2021-10-02 LAB — ANTI-SMOOTH MUSCLE ANTIBODY, IGG: Actin (Smooth Muscle) Antibody (IGG): <20 U (ref <20)

## 2021-10-02 LAB — CERULOPLASMIN: Ceruloplasmin: 23 mg/dL (ref 18–53)

## 2021-10-02 LAB — IGG: IgG (Immunoglobin G), Serum: 1105 mg/dL (ref 600–1640)

## 2021-10-02 LAB — MITOCHONDRIAL ANTIBODIES: Mitochondrial M2 Ab, IgG: <=20 U (ref <=20.0)

## 2021-10-02 LAB — ALPHA-1-ANTITRYPSIN: A-1 Antitrypsin, Ser: 149 mg/dL (ref 83–199)

## 2021-10-02 LAB — IGA: Immunoglobulin A: 340 mg/dL — ABNORMAL HIGH (ref 47–310)

## 2021-10-09 NOTE — Progress Notes (Signed)
Reviewed and agree with documentation and assessment and plan. K. Veena Donyea Beverlin , MD   

## 2022-02-24 ENCOUNTER — Encounter: Payer: Self-pay | Admitting: Dermatology

## 2022-02-24 ENCOUNTER — Ambulatory Visit: Payer: BC Managed Care – PPO | Admitting: Dermatology

## 2022-02-24 DIAGNOSIS — Z808 Family history of malignant neoplasm of other organs or systems: Secondary | ICD-10-CM | POA: Diagnosis not present

## 2022-02-24 DIAGNOSIS — Z1283 Encounter for screening for malignant neoplasm of skin: Secondary | ICD-10-CM | POA: Diagnosis not present

## 2022-02-24 DIAGNOSIS — L814 Other melanin hyperpigmentation: Secondary | ICD-10-CM

## 2022-02-24 DIAGNOSIS — L578 Other skin changes due to chronic exposure to nonionizing radiation: Secondary | ICD-10-CM

## 2022-02-24 DIAGNOSIS — L821 Other seborrheic keratosis: Secondary | ICD-10-CM

## 2022-02-24 DIAGNOSIS — Q825 Congenital non-neoplastic nevus: Secondary | ICD-10-CM

## 2022-02-24 DIAGNOSIS — L219 Seborrheic dermatitis, unspecified: Secondary | ICD-10-CM

## 2022-02-24 DIAGNOSIS — D1801 Hemangioma of skin and subcutaneous tissue: Secondary | ICD-10-CM

## 2022-02-24 DIAGNOSIS — L858 Other specified epidermal thickening: Secondary | ICD-10-CM

## 2022-02-24 MED ORDER — BETAMETHASONE VALERATE 0.12 % EX FOAM: CUTANEOUS | 2 refills | Status: DC

## 2022-02-24 MED ORDER — CICLOPIROX 1 % EX SHAM: MEDICATED_SHAMPOO | CUTANEOUS | 5 refills | Status: DC

## 2022-02-24 NOTE — Progress Notes (Signed)
New Patient Visit  Subjective  Erin Whitney is a 31 y.o. female who presents for the following: Annual Exam (Hx of abnormal moles removed from scalp as teenager. No personal Hx of skin cancer. Paternal grandfather hx of metastatic melanoma. Father has Hx of NMSC).  She has long history of itchy scalp and has tried numerous over-the-counter medicated shampoos.  Some helped temporarily but they all eventually stopped working.  She is currently using head and shoulders clinical strength.  The patient presents for Total-Body Skin Exam (TBSE) for skin cancer screening and mole check.  The patient has spots, moles and lesions to be evaluated, some may be new or changing and the patient has concerns that these could be cancer.  Review of Systems: No other skin or systemic complaints except as noted in HPI or Assessment and Plan.   Objective  Well appearing patient in no apparent distress; mood and affect are within normal limits.  A full examination was performed including scalp, head, eyes, ears, nose, lips, neck, chest, axillae, abdomen, back, buttocks, bilateral upper extremities, bilateral lower extremities, hands, feet, fingers, toes, fingernails, and toenails. All findings within normal limits unless otherwise noted below.  Scalp Mild scaling in scalp, pt c/o itching  suprapubic 57m fleshy brown papule   Assessment & Plan   Family history of skin cancer - what type(s): MM, metastatic; NMSC - who affected: Paternal grandfather; Father  Lentigines. Ears, back - Scattered tan macules - Due to sun exposure - Benign-appearing, observe - Recommend daily broad spectrum sunscreen SPF 30+ to sun-exposed areas, reapply every 2 hours as needed. - Call for any changes   Melanocytic Nevi - Tan-brown and/or pink-flesh-colored symmetric macules and papules - Benign appearing on exam today - Observation - Call clinic for new or changing moles - Recommend daily use of broad spectrum spf  30+ sunscreen to sun-exposed areas.   Hemangiomas - Red papules - Discussed benign nature - Observe - Call for any changes  Actinic Damage - Chronic condition, secondary to cumulative UV/sun exposure - diffuse scaly erythematous macules with underlying dyspigmentation - Recommend daily broad spectrum sunscreen SPF 30+ to sun-exposed areas, reapply every 2 hours as needed.  - Staying in the shade or wearing long sleeves, sun glasses (UVA+UVB protection) and wide brim hats (4-inch brim around the entire circumference of the hat) are also recommended for sun protection.  - Call for new or changing lesions.  Skin cancer screening performed today.  Keratosis Pilaris. Arms - Tiny follicular keratotic papules - Benign. Genetic in nature. No cure. - Observe. - If desired, patient can use an emollient (moisturizer) containing ammonium lactate, urea or salicylic acid once a day to smooth the area  Seborrheic dermatitis Scalp  Chronic and persistent condition with duration or expected duration over one year. Condition is symptomatic / bothersome to patient. Not to goal.  Seborrheic Dermatitis  -  is a chronic persistent rash characterized by pinkness and scaling most commonly of the mid face but also can occur on the scalp (dandruff), ears; mid chest, mid back and groin.  It tends to be exacerbated by stress and cooler weather.  People who have neurologic disease may experience new onset or exacerbation of existing seborrheic dermatitis.  The condition is not curable but treatable and can be controlled.   Start Ciclopirox shampoo 2-3 times per week lather on scalp, leave on 8-10 minutes, rinse well.  Start Luxiq foam 1-2 times daily to scalp as needed for itching. Avoid applying  to face, groin, and axilla. Use as directed. Long-term use can cause thinning of the skin.  Topical steroids (such as triamcinolone, fluocinolone, fluocinonide, mometasone, clobetasol, halobetasol, betamethasone,  hydrocortisone) can cause thinning and lightening of the skin if they are used for too long in the same area. Your physician has selected the right strength medicine for your problem and area affected on the body. Please use your medication only as directed by your physician to prevent side effects.    Ciclopirox 1 % shampoo - Scalp 2-3 times per week lather on scalp, leave on 8-10 minutes, rinse well  Betamethasone Valerate (LUXIQ) 0.12 % foam - Scalp Apply 1-2 times daily to affected areas on scalp as needed for itching.  Congenital non-neoplastic nevus suprapubic  Benign-appearing.  Observation.  Call clinic for new or changing lesions.  Recommend daily use of broad spectrum spf 30+ sunscreen to sun-exposed areas.     Return in about 1 year (around 02/25/2023) for TBSE, FmHxMM.  I, Emelia Salisbury, CMA, am acting as scribe for Brendolyn Patty, MD.  Documentation: I have reviewed the above documentation for accuracy and completeness, and I agree with the above.  Brendolyn Patty MD

## 2022-02-24 NOTE — Patient Instructions (Addendum)
Scalp: Start Ciclopirox shampoo 2-3 times per week lather on scalp, leave on 8-10 minutes, rinse well.  Start Luxiq foam 1-2 times daily to scalp as needed for itching. Avoid applying to face, groin, and axilla. Use as directed. Long-term use can cause thinning of the skin.  Topical steroids (such as triamcinolone, fluocinolone, fluocinonide, mometasone, clobetasol, halobetasol, betamethasone, hydrocortisone) can cause thinning and lightening of the skin if they are used for too long in the same area. Your physician has selected the right strength medicine for your problem and area affected on the body. Please use your medication only as directed by your physician to prevent side effects.    Arms/Keratosis Pilaris: Recommend starting moisturizer with exfoliant (Urea, Salicylic acid, or Lactic acid) one to two times daily to help smooth rough and bumpy skin.  OTC options include Cetaphil Rough and Bumpy lotion (Urea), Eucerin Roughness Relief lotion or spot treatment cream (Urea), CeraVe SA lotion/cream for Rough and Bumpy skin (Sal Acid), Gold Bond Rough and Bumpy cream (Sal Acid), and AmLactin 12% lotion/cream (Lactic Acid).  If applying in morning, also apply sunscreen to sun-exposed areas, since these exfoliating moisturizers can increase sensitivity to sun.    Recommend daily broad spectrum sunscreen SPF 30+ to sun-exposed areas, reapply every 2 hours as needed. Call for new or changing lesions.  Staying in the shade or wearing long sleeves, sun glasses (UVA+UVB protection) and wide brim hats (4-inch brim around the entire circumference of the hat) are also recommended for sun protection.    Melanoma ABCDEs  Melanoma is the most dangerous type of skin cancer, and is the leading cause of death from skin disease.  You are more likely to develop melanoma if you: Have light-colored skin, light-colored eyes, or red or blond hair Spend a lot of time in the sun Tan regularly, either outdoors or in  a tanning bed Have had blistering sunburns, especially during childhood Have a close family member who has had a melanoma Have atypical moles or large birthmarks  Early detection of melanoma is key since treatment is typically straightforward and cure rates are extremely high if we catch it early.   The first sign of melanoma is often a change in a mole or a new dark spot.  The ABCDE system is a way of remembering the signs of melanoma.  A for asymmetry:  The two halves do not match. B for border:  The edges of the growth are irregular. C for color:  A mixture of colors are present instead of an even brown color. D for diameter:  Melanomas are usually (but not always) greater than 29m - the size of a pencil eraser. E for evolution:  The spot keeps changing in size, shape, and color.  Please check your skin once per month between visits. You can use a small mirror in front and a large mirror behind you to keep an eye on the back side or your body.   If you see any new or changing lesions before your next follow-up, please call to schedule a visit.  Please continue daily skin protection including broad spectrum sunscreen SPF 30+ to sun-exposed areas, reapplying every 2 hours as needed when you're outdoors.   Staying in the shade or wearing long sleeves, sun glasses (UVA+UVB protection) and wide brim hats (4-inch brim around the entire circumference of the hat) are also recommended for sun protection.     Due to recent changes in healthcare laws, you may see results of your  pathology and/or laboratory studies on MyChart before the doctors have had a chance to review them. We understand that in some cases there may be results that are confusing or concerning to you. Please understand that not all results are received at the same time and often the doctors may need to interpret multiple results in order to provide you with the best plan of care or course of treatment. Therefore, we ask that you  please give [place] 2 business days to thoroughly review all your results before contacting the office for clarification. Should we see a critical lab result, you will be contacted sooner.   If You Need Anything After Your Visit  If you have any questions or concerns for your doctor, please call our main line at [phone] and press option 4 to reach your doctor's medical assistant. If no one answers, please leave a voicemail as directed and we will return your call as soon as possible. Messages left after 4 pm will be answered the following business day.   You may also send [place] a message via [place]. We typically respond to MyChart messages within 1-2 business days.  For prescription refills, please ask your pharmacy to contact our office. Our fax number is [phone].  If you have an urgent issue when the clinic is closed that cannot wait until the next business day, you can page your doctor at the number below.    Please note that while we do our best to be available for urgent issues outside of office hours, we are not available 24/7.   If you have an urgent issue and are unable to reach [place], you may choose to seek medical care at your doctor's office, retail clinic, urgent care center, or emergency room.  If you have a medical emergency, please immediately call 911 or go to the emergency department.  Pager Numbers  - Dr. Nehemiah Massed: [phone]  - Dr. Laurence Ferrari: [phone]  - Dr. Nicole Kindred: [phone]  In the event of inclement weather, please call our main line at [phone] for an update on the status of any delays or closures.  Dermatology Medication Tips: Please keep the boxes that topical medications come in in order to help keep track of the instructions about where and how to use these. Pharmacies typically print the medication instructions only on the boxes and not directly on the medication tubes.   If your medication is too expensive, please contact our office at  [phone] option 4 or send [place] a message through [place].   We are unable to tell what your co-pay for medications will be in advance as this is different depending on your insurance coverage. However, we may be able to find a substitute medication at lower cost or fill out paperwork to get insurance to cover a needed medication.   If a prior authorization is required to get your medication covered by your insurance company, please allow [place] 1-2 business days to complete this process.  Drug prices often vary depending on where the prescription is filled and some pharmacies may offer cheaper prices.  The website www.goodrx.com contains coupons for medications through different pharmacies. The prices here do not account for what the cost may be with help from insurance (it may be cheaper with your insurance), but the website can give you the price if you did not use any insurance.  - You can print the associated coupon and take it with your prescription to the pharmacy.  - You may also stop by  our office during regular business hours and pick up a GoodRx coupon card.  - If you need your prescription sent electronically to a different pharmacy, notify our office through [facility] or by phone at [phone] option 4.     Si Usted Necesita Algo Despus de Su Visita  Tambin puede enviarnos un mensaje a travs de Pharmacist, community. Por lo general respondemos a los mensajes de MyChart en el transcurso de 1 a 2 das hbiles.  Para renovar recetas, por favor pida a su farmacia que se ponga en contacto con nuestra oficina. Harland Dingwall de fax es [place] [phone].  Si tiene un asunto urgente cuando la clnica est cerrada y que no puede esperar hasta el siguiente da hbil, puede llamar/localizar a su doctor(a) al nmero que aparece a continuacin.   Por favor, tenga en cuenta que aunque hacemos todo lo posible para estar disponibles para asuntos urgentes fuera del horario de [place], no estamos  disponibles las 24 horas del da, los 7 das de la [place].   Si tiene un problema urgente y no puede comunicarse con nosotros, puede optar por buscar atencin mdica  en el consultorio de su doctor(a), en una clnica privada, en un centro de atencin urgente o en una sala de emergencias.  Si tiene Engineering geologist, por favor llame inmediatamente al 911 o vaya a la sala de emergencias.  Nmeros de bper  - Dr. Nehemiah Massed: [phone]  - Dra. Moye: [phone]  - Dra. Nicole Kindred: [phone]  En caso de inclemencias del [place], por favor llame a Johnsie Kindred principal al [phone] para una actualizacin sobre el [place] de cualquier retraso o cierre.  Consejos para la medicacin en dermatologa: Por favor, guarde las cajas en las que vienen los medicamentos de uso tpico para ayudarle a seguir las instrucciones sobre dnde y cmo usarlos. Las farmacias generalmente imprimen las instrucciones del medicamento slo en las cajas y no directamente en los tubos del [place].   Si su medicamento es muy caro, por favor, pngase en contacto con Zigmund Daniel llamando al [phone] y presione la opcin 4 o envenos un mensaje a travs de Pharmacist, community.   No podemos decirle cul ser su copago por los medicamentos por adelantado ya que esto es diferente dependiendo de la cobertura de su seguro. Sin embargo, es posible que podamos encontrar un medicamento sustituto a Electrical engineer un formulario para que el seguro cubra el medicamento que se considera necesario.   Si se requiere una autorizacin previa para que su compaa de seguros [place] su medicamento, por favor permtanos de 1 a 2 das hbiles para completar este proceso.  Los precios de los medicamentos varan con frecuencia dependiendo del Environmental consultant de dnde se surte la receta y alguna farmacias pueden ofrecer precios ms baratos.  El sitio web www.goodrx.com tiene cupones para medicamentos de Airline pilot. Los precios aqu no  tienen en cuenta lo que podra costar con la ayuda del seguro (puede ser ms barato con su seguro), pero el sitio web puede darle el precio si no utiliz Research scientist (physical sciences).  - Puede imprimir el cupn correspondiente y llevarlo con su receta a la farmacia.  - Tambin puede pasar por nuestra oficina durante el horario de atencin regular y Charity fundraiser una tarjeta de cupones de GoodRx.  - Si necesita que su receta se enve electrnicamente a una farmacia diferente, informe a nuestra oficina a travs de MyChart de [place] o por telfono llamando al [phone] y presione la opcin 4.

## 2022-04-06 ENCOUNTER — Other Ambulatory Visit: Payer: Self-pay

## 2022-04-06 DIAGNOSIS — R7989 Other specified abnormal findings of blood chemistry: Secondary | ICD-10-CM

## 2022-04-13 ENCOUNTER — Telehealth: Payer: Self-pay

## 2022-04-13 NOTE — Telephone Encounter (Signed)
Left message for patient to call back. Needs reminder to come in for labs (6 month follow up).

## 2022-04-14 NOTE — Telephone Encounter (Signed)
Left message for patient to call back  

## 2022-04-15 NOTE — Telephone Encounter (Signed)
Left detailed message for patient to come in for lab work & advised her on when/where to go, and to call back with any further questions.

## 2022-07-02 ENCOUNTER — Encounter: Payer: Self-pay | Admitting: Family

## 2022-07-03 ENCOUNTER — Encounter: Payer: Self-pay | Admitting: Primary Care

## 2022-07-03 ENCOUNTER — Telehealth (INDEPENDENT_AMBULATORY_CARE_PROVIDER_SITE_OTHER): Payer: BC Managed Care – PPO | Admitting: Primary Care

## 2022-07-03 VITALS — Temp 98.7°F | Ht 71.0 in | Wt 216.0 lb

## 2022-07-03 DIAGNOSIS — U071 COVID-19: Secondary | ICD-10-CM | POA: Insufficient documentation

## 2022-07-03 MED ORDER — NIRMATRELVIR/RITONAVIR (PAXLOVID)TABLET: 3.0000 | ORAL_TABLET | Freq: Two times a day (BID) | ORAL | 0 refills | Status: AC

## 2022-07-03 NOTE — Patient Instructions (Signed)
Start the Paxlovid medication for Covid. Take 3 capsules by mouth twice daily for 5 days.  Remain in quarantine for a total of 5 days as discussed.  It was a pleasure meeting you!

## 2022-07-03 NOTE — Progress Notes (Signed)
Patient ID: Erin Whitney, female    DOB: 14-Aug-1990, 32 y.o.   MRN: [id number]  Virtual visit completed through [place], a video enabled telemedicine application. Due to national recommendations of social distancing due to COVID-19, a virtual visit is felt to be most appropriate for this patient at this time. Reviewed limitations, risks, security and privacy concerns of performing a virtual visit and the availability of in person appointments. I also reviewed that there may be a patient responsible charge related to this service. The patient agreed to proceed.   Patient location: home Provider location:  at [facility], office Persons participating in this virtual visit: patient, provider   If any vitals were documented, they were collected by patient at home unless specified below.    Temp 98.7 F (37.1 C) (Temporal)   Ht 5' 11"$  (1.803 m)   Wt 216 lb (98 kg)   BMI 30.13 kg/m    CC: Cpvid-19 infection Subjective:   HPI: Erin Whitney is a 32 y.o. female patient of Tabitha, NP with a history of exercise induced asthma and migraines presenting on 07/03/2022 for Covid Positive (Tested pos covid yesterday //X1 day Migraine, body aches, cough, sore throat, achy ears, congestion)   Symptom onset yesterday with body aches, severe headaches. This morning she developed sore throat, cough, and congestion. She tested positive for Covid-19 infection yesterday.  She's been taking Tylenol and Ibuprofen for her headaches, also taking EmergenC. Her headaches have improved temporarily. She did run a fever of 101 yesterday. Her body aches have resolved today.        Relevant past medical, surgical, family and social history reviewed and updated as indicated. Interim medical history since our last visit reviewed. Allergies and medications reviewed and updated. Outpatient Medications Prior to Visit  Medication Sig Dispense Refill   albuterol (VENTOLIN HFA) 108 (90 Base) MCG/ACT  inhaler Inhale 2 puffs into the lungs every 6 (six) hours as needed for wheezing or shortness of breath. 18 g 1   Betamethasone Valerate (LUXIQ) 0.12 % foam Apply 1-2 times daily to affected areas on scalp as needed for itching. 100 g 2   Multiple Vitamin (MULTIVITAMIN) capsule Take 1 capsule by mouth daily.     Ciclopirox 1 % shampoo 2-3 times per week lather on scalp, leave on 8-10 minutes, rinse well (Patient not taking: Reported on 07/03/2022) 120 mL 5   Norethindrone Acetate-Ethinyl Estrad-FE (LOESTRIN 24 FE) 1-20 MG-MCG(24) tablet Take 1 tablet by mouth daily. (Patient not taking: Reported on 09/25/2021) 28 tablet 11   Saccharomyces boulardii (PROBIOTIC) 250 MG CAPS Take by mouth. (Patient not taking: Reported on 07/03/2022)     No facility-administered medications prior to visit.     Per HPI unless specifically indicated in ROS section below Review of Systems Objective:  Temp 98.7 F (37.1 C) (Temporal)   Ht 5' 11"$  (1.803 m)   Wt 216 lb (98 kg)   BMI 30.13 kg/m   Wt Readings from Last 3 Encounters:  07/03/22 216 lb (98 kg)  09/25/21 205 lb 9.6 oz (93.3 kg)  09/02/21 209 lb 3 oz (94.9 kg)       Physical exam: General: Alert and oriented x 3, no distress, does appear sickly  Pulmonary: Speaks in complete sentences without increased work of breathing, no cough during visit.  Psychiatric: Normal mood, thought content, and behavior.     Results for orders placed or performed in visit on 09/25/21  CBC with Differential/Platelet  Result Value  Ref Range   WBC 8.8 4.0 - 10.5 K/uL   RBC 4.59 3.87 - 5.11 Mil/uL   Hemoglobin 14.1 12.0 - 15.0 g/dL   HCT 42.1 36.0 - 46.0 %   MCV 91.8 78.0 - 100.0 fl   MCHC 33.6 30.0 - 36.0 g/dL   RDW 12.1 11.5 - 15.5 %   Platelets 197.0 150.0 - 400.0 K/uL   Neutrophils Relative % 56.1 43.0 - 77.0 %   Lymphocytes Relative 29.2 12.0 - 46.0 %   Monocytes Relative 11.6 3.0 - 12.0 %   Eosinophils Relative 2.4 0.0 - 5.0 %   Basophils Relative 0.7 0.0  - 3.0 %   Neutro Abs 4.9 1.4 - 7.7 K/uL   Lymphs Abs 2.6 0.7 - 4.0 K/uL   Monocytes Absolute 1.0 0.1 - 1.0 K/uL   Eosinophils Absolute 0.2 0.0 - 0.7 K/uL   Basophils Absolute 0.1 0.0 - 0.1 K/uL  Comprehensive metabolic panel  Result Value Ref Range   Sodium 137 135 - 145 mEq/L   Potassium 4.0 3.5 - 5.1 mEq/L   Chloride 103 96 - 112 mEq/L   CO2 25 19 - 32 mEq/L   Glucose, Bld 85 70 - 99 mg/dL   BUN 10 6 - 23 mg/dL   Creatinine, Ser 1.00 0.40 - 1.20 mg/dL   Total Bilirubin 0.7 0.2 - 1.2 mg/dL   Alkaline Phosphatase 71 39 - 117 U/L   AST 92 (H) 0 - 37 U/L   ALT 111 (H) 0 - 35 U/L   Total Protein 7.8 6.0 - 8.3 g/dL   Albumin 4.6 3.5 - 5.2 g/dL   GFR 75.51 >60.00 mL/min   Calcium 9.6 8.4 - 10.5 mg/dL  Protime-INR  Result Value Ref Range   INR 1.0 0.8 - 1.0 ratio   Prothrombin Time 11.0 9.6 - 13.1 sec  Iron and TIBC  Result Value Ref Range   Total Iron Binding Capacity 399 250 - 450 ug/dL   UIBC 262 131 - 425 ug/dL   Iron 137 27 - 159 ug/dL   Iron Saturation 34 15 - 55 %  Ferritin  Result Value Ref Range   Ferritin 81.2 10.0 - 291.0 ng/mL  Tissue transglutaminase, IgA  Result Value Ref Range   (tTG) Ab, IgA <1.0 U/mL  IgG  Result Value Ref Range   IgG (Immunoglobin G), Serum 1,105 600 - 1,640 mg/dL  IgA  Result Value Ref Range   Immunoglobulin A 340 (H) 47 - 310 mg/dL  ANA  Result Value Ref Range   Anti Nuclear Antibody (ANA) NEGATIVE NEGATIVE  Anti-smooth muscle antibody, IgG  Result Value Ref Range   Actin (Smooth Muscle) Antibody (IGG) <20 <20 U  Mitochondrial antibodies  Result Value Ref Range   Mitochondrial M2 Ab, IgG <=20.0 <=20.0 U  Alpha-1-antitrypsin  Result Value Ref Range   A-1 Antitrypsin, Ser 149 83 - 199 mg/dL  Ceruloplasmin  Result Value Ref Range   Ceruloplasmin 23 18 - 53 mg/dL  TSH  Result Value Ref Range   TSH 1.12 0.35 - 5.50 uIU/mL   Assessment & Plan:   Problem List Items Addressed This Visit       Other   COVID-19 virus infection  - Primary    Day two of symptoms.  Given her history of asthma, she is a candidate for anti-viral treatment. She agrees to proceed.  Reviewed renal function from May 2023. Rx for Paxlovid sent to pharmacy. Discussed instructions for use.  Discussed quarantine recommendations from  CDC. Work note provided.       Relevant Medications   nirmatrelvir/ritonavir (PAXLOVID) 20 x 150 MG & 10 x 100MG TABS     Meds ordered this encounter  Medications   nirmatrelvir/ritonavir (PAXLOVID) 20 x 150 MG & 10 x 100MG TABS    Sig: Take 3 tablets by mouth 2 (two) times daily for 5 days.    Dispense:  30 tablet    Refill:  0    Order Specific Question:   Supervising Provider    Answer:   BEDSOLE, AMY E [[id number]]   No orders of the defined types were placed in this encounter.   I discussed the assessment and treatment plan with the patient. The patient was provided an opportunity to ask questions and all were answered. The patient agreed with the plan and demonstrated an understanding of the instructions. The patient was advised to call back or seek an in-person evaluation if the symptoms worsen or if the condition fails to improve as anticipated.  Follow up plan:  Start the Paxlovid medication for Covid. Take 3 capsules by mouth twice daily for 5 days.  Remain in quarantine for a total of 5 days as discussed.  It was a pleasure meeting you!   Pleas Koch, NP

## 2022-07-03 NOTE — Assessment & Plan Note (Signed)
Day two of symptoms.  Given her history of asthma, she is a candidate for anti-viral treatment. She agrees to proceed.  Reviewed renal function from May 2023. Rx for Paxlovid sent to pharmacy. Discussed instructions for use.  Discussed quarantine recommendations from [facility]. Work note provided.

## 2022-07-30 ENCOUNTER — Encounter: Payer: Self-pay | Admitting: Family Medicine

## 2022-07-30 ENCOUNTER — Ambulatory Visit (INDEPENDENT_AMBULATORY_CARE_PROVIDER_SITE_OTHER): Payer: BC Managed Care – PPO | Admitting: Family Medicine

## 2022-07-30 VITALS — BP 146/87 | HR 92 | Wt 222.0 lb

## 2022-07-30 DIAGNOSIS — Z3009 Encounter for other general counseling and advice on contraception: Secondary | ICD-10-CM

## 2022-07-30 DIAGNOSIS — Z01419 Encounter for gynecological examination (general) (routine) without abnormal findings: Secondary | ICD-10-CM | POA: Diagnosis not present

## 2022-07-30 NOTE — Patient Instructions (Signed)

## 2022-07-30 NOTE — Progress Notes (Signed)
CC: discuss Birth control options  Currently no birth control Unprotected sex in last 2 weeks: yes   Last pap: 2022 WNL

## 2022-07-30 NOTE — Progress Notes (Signed)
Subjective:     Erin Whitney is a 32 y.o. female and is here for a comprehensive physical exam. The patient reports no problems. Off her COCs due to elevated LFTs with her primary. They are using a withdrawal method.  The following portions of the patient's history were reviewed and updated as appropriate: allergies, current medications, past family history, past medical history, past social history, past surgical history, and problem list.  Review of Systems Pertinent items noted in HPI and remainder of comprehensive ROS otherwise negative.   Objective:    BP (!) 146/87   Pulse 92   Wt 222 lb (100.7 kg)   LMP 07/19/2022 (Approximate)   BMI 30.96 kg/m  General appearance: alert, cooperative, and appears stated age Head: Normocephalic, without obvious abnormality, atraumatic Neck: supple, symmetrical, trachea midline Lungs:  normal effort Heart: regular rate and rhythm Abdomen:  soft, non-tender Neurologic: Grossly normal    Assessment:    Healthy female exam.      Plan:  Encounter for gynecological examination without abnormal finding - Normal pap in last 3 years, due again 2025  Encounter for counseling regarding contraception - will proceed with Paragard, return with menses, no unprotected IC from menses until appointment  Return in 4 weeks (on 08/27/2022) for IUD insertion.    See After Visit Summary for Counseling Recommendations

## 2022-08-19 ENCOUNTER — Encounter: Payer: Self-pay | Admitting: Family Medicine

## 2022-08-19 ENCOUNTER — Telehealth: Payer: Self-pay

## 2022-08-19 ENCOUNTER — Ambulatory Visit: Payer: BC Managed Care – PPO | Admitting: Family Medicine

## 2022-08-19 VITALS — BP 140/84 | HR 88 | Wt 224.0 lb

## 2022-08-19 DIAGNOSIS — Z3202 Encounter for pregnancy test, result negative: Secondary | ICD-10-CM | POA: Diagnosis not present

## 2022-08-19 DIAGNOSIS — Z30430 Encounter for insertion of intrauterine contraceptive device: Secondary | ICD-10-CM

## 2022-08-19 LAB — POCT URINE PREGNANCY: Preg Test, Ur: NEGATIVE

## 2022-08-19 MED ORDER — PARAGARD INTRAUTERINE COPPER IU IUD
1.0000 | INTRAUTERINE_SYSTEM | Freq: Once | INTRAUTERINE | Status: AC
  Administered 2022-08-19: 1 via INTRAUTERINE

## 2022-08-19 NOTE — Progress Notes (Signed)
   Subjective:    Patient ID: Erin Whitney is a 32 y.o. female presenting with Contraception  on 08/19/2022  HPI: Here for IUD insertion.No unprotected intercourse since last menses.   Review of Systems  Constitutional:  Negative for chills and fever.  Respiratory:  Negative for shortness of breath.   Cardiovascular:  Negative for chest pain.  Gastrointestinal:  Negative for abdominal pain, nausea and vomiting.  Genitourinary:  Negative for dysuria.  Skin:  Negative for rash.      Objective:    BP (!) 140/84   Pulse 88   Wt 224 lb (101.6 kg)   LMP 07/20/2022 (Approximate)   BMI 31.24 kg/m  Physical Exam Exam conducted with a chaperone present.  Constitutional:      General: She is not in acute distress.    Appearance: She is well-developed.  HENT:     Head: Normocephalic and atraumatic.  Eyes:     General: No scleral icterus. Cardiovascular:     Rate and Rhythm: Normal rate.  Pulmonary:     Effort: Pulmonary effort is normal.  Abdominal:     Palpations: Abdomen is soft.  Genitourinary:    General: Normal vulva.     Vagina: Normal.     Cervix: Normal.  Musculoskeletal:     Cervical back: Neck supple.  Skin:    General: Skin is warm and dry.  Neurological:     Mental Status: She is alert and oriented to person, place, and time.    Procedure: Patient identified, informed consent performed, signed copy in chart, time out was performed.  Urine pregnancy test negative.  Speculum placed in the vagina.  Cervix visualized.  Cleaned with Betadine x 2.  Grasped anteriourly with a single tooth tenaculum.  Uterus sounded to 8 cm.  Paragard IUD placed per manufacturer's recommendations.  Strings trimmed to 3 cm.   Patient given post procedure instructions and Paragard care card with expiration date.     Assessment & Plan:  Encounter for IUD insertion - Paragard placed today - Plan: POCT urine pregnancy, paragard intrauterine copper IUD 1 each   Return in about 4  weeks (around 09/16/2022) for iud check.  Donnamae Jude, MD 08/19/2022 11:41 AM

## 2022-08-19 NOTE — Telephone Encounter (Signed)
Called pt to notify her to check her IUD strings in a week or two and if ok, no need to see her back. Pt understood

## 2022-08-19 NOTE — Progress Notes (Signed)
RGYN pt presents for Paragard insertion per notes  LMP:07/20/22  Pt denies any unprotected intercourse x 14 days.

## 2023-01-27 ENCOUNTER — Ambulatory Visit: Payer: BC Managed Care – PPO | Admitting: Dermatology

## 2023-01-27 VITALS — BP 158/91 | HR 106

## 2023-01-27 DIAGNOSIS — W908XXA Exposure to other nonionizing radiation, initial encounter: Secondary | ICD-10-CM

## 2023-01-27 DIAGNOSIS — D229 Melanocytic nevi, unspecified: Secondary | ICD-10-CM

## 2023-01-27 DIAGNOSIS — Z1283 Encounter for screening for malignant neoplasm of skin: Secondary | ICD-10-CM | POA: Diagnosis not present

## 2023-01-27 DIAGNOSIS — L811 Chloasma: Secondary | ICD-10-CM

## 2023-01-27 DIAGNOSIS — Z808 Family history of malignant neoplasm of other organs or systems: Secondary | ICD-10-CM

## 2023-01-27 DIAGNOSIS — D1801 Hemangioma of skin and subcutaneous tissue: Secondary | ICD-10-CM

## 2023-01-27 DIAGNOSIS — L219 Seborrheic dermatitis, unspecified: Secondary | ICD-10-CM | POA: Diagnosis not present

## 2023-01-27 DIAGNOSIS — L858 Other specified epidermal thickening: Secondary | ICD-10-CM | POA: Diagnosis not present

## 2023-01-27 DIAGNOSIS — L814 Other melanin hyperpigmentation: Secondary | ICD-10-CM

## 2023-01-27 DIAGNOSIS — L821 Other seborrheic keratosis: Secondary | ICD-10-CM

## 2023-01-27 DIAGNOSIS — L578 Other skin changes due to chronic exposure to nonionizing radiation: Secondary | ICD-10-CM

## 2023-01-27 NOTE — Patient Instructions (Addendum)
Keratosis Pilaris (arms) Recommend starting moisturizer with exfoliant (Urea, Salicylic acid, or Lactic acid) one to two times daily to help smooth rough and bumpy skin.  OTC options include Cetaphil Rough and Bumpy lotion (Urea), Eucerin Roughness Relief lotion or spot treatment cream (Urea), CeraVe SA lotion/cream for Rough and Bumpy skin (Sal Acid), Gold Bond Rough and Bumpy cream (Sal Acid), and AmLactin 12% lotion/cream (Lactic Acid).  If applying in morning, also apply sunscreen to sun-exposed areas, since these exfoliating moisturizers can increase sensitivity to sun.    Melanoma ABCDEs  Melanoma is the most dangerous type of skin cancer, and is the leading cause of death from skin disease.  You are more likely to develop melanoma if you: Have light-colored skin, light-colored eyes, or red or blond hair Spend a lot of time in the sun Tan regularly, either outdoors or in a tanning bed Have had blistering sunburns, especially during childhood Have a close family member who has had a melanoma Have atypical moles or large birthmarks  Early detection of melanoma is key since treatment is typically straightforward and cure rates are extremely high if we catch it early.   The first sign of melanoma is often a change in a mole or a new dark spot.  The ABCDE system is a way of remembering the signs of melanoma.  A for asymmetry:  The two halves do not match. B for border:  The edges of the growth are irregular. C for color:  A mixture of colors are present instead of an even brown color. D for diameter:  Melanomas are usually (but not always) greater than 6mm - the size of a pencil eraser. E for evolution:  The spot keeps changing in size, shape, and color.  Please check your skin once per month between visits. You can use a small mirror in front and a large mirror behind you to keep an eye on the back side or your body.   If you see any new or changing lesions before your next follow-up,  please call to schedule a visit.  Please continue daily skin protection including broad spectrum sunscreen SPF 30+ to sun-exposed areas, reapplying every 2 hours as needed when you're outdoors.   Staying in the shade or wearing long sleeves, sun glasses (UVA+UVB protection) and wide brim hats (4-inch brim around the entire circumference of the hat) are also recommended for sun protection.   Due to recent changes in healthcare laws, you may see results of your pathology and/or laboratory studies on MyChart before the doctors have had a chance to review them. We understand that in some cases there may be results that are confusing or concerning to you. Please understand that not all results are received at the same time and often the doctors may need to interpret multiple results in order to provide you with the best plan of care or course of treatment. Therefore, we ask that you please give [place] 2 business days to thoroughly review all your results before contacting the office for clarification. Should we see a critical lab result, you will be contacted sooner.   If You Need Anything After Your Visit  If you have any questions or concerns for your doctor, please call our main line at [phone] and press option 4 to reach your doctor's medical assistant. If no one answers, please leave a voicemail as directed and we will return your call as soon as possible. Messages left after 4 pm will be answered the following  business day.   You may also send [place] a message via MyChart. We typically respond to MyChart messages within 1-2 business days.  For prescription refills, please ask your pharmacy to contact our office. Our fax number is [phone].  If you have an urgent issue when the clinic is closed that cannot wait until the next business day, you can page your doctor at the number below.    Please note that while we do our best to be available for urgent issues outside of office hours, we are not  available 24/7.   If you have an urgent issue and are unable to reach [place], you may choose to seek medical care at your doctor's office, retail clinic, urgent care center, or emergency room.  If you have a medical emergency, please immediately call 911 or go to the emergency department.  Pager Numbers  - Dr. Gwen Pounds: [phone]  - Dr. Roseanne Reno: [phone]  - Dr. Katrinka Blazing: [phone]   In the event of inclement weather, please call our main line at [phone] for an update on the status of any delays or closures.  Dermatology Medication Tips: Please keep the boxes that topical medications come in in order to help keep track of the instructions about where and how to use these. Pharmacies typically print the medication instructions only on the boxes and not directly on the medication tubes.   If your medication is too expensive, please contact our office at [phone] option 4 or send [place] a message through MyChart.   We are unable to tell what your co-pay for medications will be in advance as this is different depending on your insurance coverage. However, we may be able to find a substitute medication at lower cost or fill out paperwork to get insurance to cover a needed medication.   If a prior authorization is required to get your medication covered by your insurance company, please allow [place] 1-2 business days to complete this process.  Drug prices often vary depending on where the prescription is filled and some pharmacies may offer cheaper prices.  The website www.goodrx.com contains coupons for medications through different pharmacies. The prices here do not account for what the cost may be with help from insurance (it may be cheaper with your insurance), but the website can give you the price if you did not use any insurance.  - You can print the associated coupon and take it with your prescription to the pharmacy.  - You may also stop by our office during regular business hours  and pick up a GoodRx coupon card.  - If you need your prescription sent electronically to a different pharmacy, notify our office through [facility] or by phone at [phone] option 4.     Si Usted Necesita Algo Despus de Su Visita  Tambin puede enviarnos un mensaje a travs de Clinical cytogeneticist. Por lo general respondemos a los mensajes de MyChart en el transcurso de 1 a 2 das hbiles.  Para renovar recetas, por favor pida a su farmacia que se ponga en contacto con nuestra oficina. Annie Sable de fax es [place] [phone].  Si tiene un asunto urgente cuando la clnica est cerrada y que no puede esperar hasta el siguiente da hbil, puede llamar/localizar a su doctor(a) al nmero que aparece a continuacin.   Por favor, tenga en cuenta que aunque hacemos todo lo posible para estar disponibles para asuntos urgentes fuera del horario de oficina, no estamos disponibles las 24 horas del da, los 7  das de [employer].   Si tiene un problema urgente y no puede comunicarse con nosotros, puede optar por buscar atencin mdica  en el consultorio de su doctor(a), en una clnica privada, en un centro de atencin urgente o en una sala de emergencias.  Si tiene Engineer, drilling, por favor llame inmediatamente al 911 o vaya a la sala de emergencias.  Nmeros de bper  - Dr. Gwen Pounds: [phone]  - Dra. Roseanne Reno: [phone]  - Dr. Katrinka Blazing: [phone]   En caso de inclemencias del tiempo, por favor llame a Lacy Duverney principal al [phone] para una actualizacin sobre el [place] de cualquier retraso o cierre.  Consejos para la medicacin en dermatologa: Por favor, guarde las cajas en las que vienen los medicamentos de uso tpico para ayudarle a seguir las instrucciones sobre dnde y cmo usarlos. Las farmacias generalmente imprimen las instrucciones del medicamento slo en las cajas y no directamente en los tubos del [place].   Si su medicamento es muy caro, por favor, pngase  en contacto con Rolm Gala llamando al [phone] y presione la opcin 4 o envenos un mensaje a travs de Clinical cytogeneticist.   No podemos decirle cul ser su copago por los medicamentos por adelantado ya que esto es diferente dependiendo de la cobertura de su seguro. Sin embargo, es posible que podamos encontrar un medicamento sustituto a Audiological scientist un formulario para que el seguro cubra el medicamento que se considera necesario.   Si se requiere una autorizacin previa para que su compaa de seguros [place] su medicamento, por favor permtanos de 1 a 2 das hbiles para completar [address]  Los precios de los medicamentos varan con frecuencia dependiendo del Environmental consultant de dnde se surte la receta y alguna farmacias pueden ofrecer precios ms baratos.  El sitio web www.goodrx.com tiene cupones para medicamentos de Health and safety inspector. Los precios aqu no tienen en cuenta lo que podra costar con la ayuda del seguro (puede ser ms barato con su seguro), pero el sitio web puede darle el precio si no utiliz Tourist information centre manager.  - Puede imprimir el cupn correspondiente y llevarlo con su receta a la farmacia.  - Tambin puede pasar por nuestra oficina durante el horario de atencin regular y Education officer, museum una tarjeta de cupones de GoodRx.  - Si necesita que su receta se enve electrnicamente a una farmacia diferente, informe a nuestra oficina a travs de MyChart de [place] o por telfono llamando al [phone] y presione la opcin 4.

## 2023-01-27 NOTE — Progress Notes (Signed)
Follow-Up Visit   Subjective  Erin Whitney is a 32 y.o. female who presents for the following: Skin Cancer Screening and Full Body Skin Exam  The patient presents for Total-Body Skin Exam (TBSE) for skin cancer screening and mole check. The patient has spots, moles and lesions to be evaluated, some may be new or changing. She has seborrheic dermatitis of the scalp and uses Luxiq foam as needed, and Head and Shoulders Clinical strength. No history of skin cancer. Family history of metastatic melanoma in grandfather.    The following portions of the chart were reviewed this encounter and updated as appropriate: medications, allergies, medical history  Review of Systems:  No other skin or systemic complaints except as noted in HPI or Assessment and Plan.  Objective  Well appearing patient in no apparent distress; mood and affect are within normal limits.  A full examination was performed including scalp, head, eyes, ears, nose, lips, neck, chest, axillae, abdomen, back, buttocks, bilateral upper extremities, bilateral lower extremities, hands, feet, fingers, toes, fingernails, and toenails. All findings within normal limits unless otherwise noted below.   Relevant physical exam findings are noted in the Assessment and Plan.    Assessment & Plan   SKIN CANCER SCREENING PERFORMED TODAY.  Family history of skin cancer - what type(s): MM, metastatic; NMSC - who affected: Paternal grandfather; Father  ACTINIC DAMAGE - Chronic condition, secondary to cumulative UV/sun exposure - diffuse scaly erythematous macules with underlying dyspigmentation - Recommend daily broad spectrum sunscreen SPF 30+ to sun-exposed areas, reapply every 2 hours as needed.  - Staying in the shade or wearing long sleeves, sun glasses (UVA+UVB protection) and wide brim hats (4-inch brim around the entire circumference of the hat) are also recommended for sun protection.  - Call for new or changing  lesions.  LENTIGINES, SEBORRHEIC KERATOSES, HEMANGIOMAS - Benign normal skin lesions - Benign-appearing - Call for any changes  MELANOCYTIC NEVI - Tan-brown and/or pink-flesh-colored symmetric macules and papules - Benign appearing on exam today - Observation - Call clinic for new or changing moles - Recommend daily use of broad spectrum spf 30+ sunscreen to sun-exposed areas.   SEBORRHEIC DERMATITIS Exam: Scalp clear today.  Chronic condition with duration or expected duration over one year. Currently well-controlled.   Seborrheic Dermatitis is a chronic persistent rash characterized by pinkness and scaling most commonly of the mid face but also can occur on the scalp (dandruff), ears; mid chest, mid back and groin.  It tends to be exacerbated by stress and cooler weather.  People who have neurologic disease may experience new onset or exacerbation of existing seborrheic dermatitis.  The condition is not curable but treatable and can be controlled.  Treatment Plan: Continue Luxiq foam prn flares. Continue Head & Shoulders Clinical.  MELASMA Exam: reticulated hyperpigmented patches at forehead and right cheek > left  Chronic and persistent condition with duration or expected duration over one year. Currently flared.  Melasma is a chronic; persistent condition of hyperpigmented patches generally on the face, worse in summer due to higher UV exposure.    Heredity; thyroid disease; sun exposure; pregnancy; birth control pills; epilepsy medication and darker skin may predispose to Melasma.   Recommendations include: - Sun avoidance and daily broad spectrum (UVA/UVB) tinted mineral sunscreen SPF 30+, with Zinc or Titanium Dioxide. - Rx topical bleaching creams (i.e. hydroquinone) is a common treatment but should not be used long term.  Hydroquinones may be mixed with retinoids; vitamin C; steroids; Kojic Acid. -  Alastin A-luminate, retinoids, vitamin C, topical tranexamic acid, glycolic  acid and kojic acid can be used for brightening while on break from hydroquinone - Rx Azelaic Acid is also a treatment option that is safe for pregnancy (Category B). - OTC Heliocare can be helpful in control and prevention. - Oral Rx with Tranexamic Acid 250 mg - 650 mg po daily can be used for moderate to severe cases especially during summer (contraindications include pregnancy; lactation; hx of PE; hx of DVT; clotting disorder; heart disease; anticoagulant use and upcoming long trips)   - Chemical peels (would need multiple for best result).  - Lasers and  Microdermabrasion may also be helpful adjunct treatments.  Treatment Plan: Discussed hydroquinone. Patient defers today, not bothersome. 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 Recommend observation. Call clinic for new or changing moles. Recommend daily use of broad spectrum spf 30+ sunscreen to sun-exposed areas.   KERATOSIS PILARIS - Tiny follicular keratotic papules - Benign. Genetic in nature. No cure. - Observe. - If desired, patient can use an emollient (moisturizer) containing ammonium lactate (AmLactin), urea or salicylic acid once a day to smooth the area  Recommend starting moisturizer with exfoliant (Urea, Salicylic acid, or Lactic acid) one to two times daily to help smooth rough and bumpy skin.  OTC options include Cetaphil Rough and Bumpy lotion (Urea), Eucerin Roughness Relief lotion or spot treatment cream (Urea), CeraVe SA lotion/cream for Rough and Bumpy skin (Sal Acid), Gold Bond Rough and Bumpy cream (Sal Acid), and AmLactin 12% lotion/cream (Lactic Acid).  If applying in morning, also apply sunscreen to sun-exposed areas, since these exfoliating moisturizers can increase sensitivity to sun.   Return in about 1 year (around 01/27/2024) for TBSE, FHx melanoma.  ICherlyn Labella, CMA, am acting as scribe for  Willeen Niece, MD .   Documentation: I have reviewed the above documentation for accuracy and completeness, and I agree with the above.  Willeen Niece, MD

## 2023-03-02 ENCOUNTER — Ambulatory Visit: Payer: BC Managed Care – PPO | Admitting: Dermatology

## 2023-04-23 ENCOUNTER — Encounter: Payer: Self-pay | Admitting: Family

## 2023-04-23 ENCOUNTER — Ambulatory Visit (INDEPENDENT_AMBULATORY_CARE_PROVIDER_SITE_OTHER): Payer: BC Managed Care – PPO | Admitting: Family

## 2023-04-23 ENCOUNTER — Encounter: Payer: Self-pay | Admitting: *Deleted

## 2023-04-23 ENCOUNTER — Ambulatory Visit: Payer: BC Managed Care – PPO

## 2023-04-23 ENCOUNTER — Other Ambulatory Visit: Payer: Self-pay | Admitting: Family

## 2023-04-23 VITALS — BP 138/82 | HR 120 | Temp 97.1°F | Ht 70.0 in | Wt 230.0 lb

## 2023-04-23 DIAGNOSIS — L219 Seborrheic dermatitis, unspecified: Secondary | ICD-10-CM | POA: Diagnosis not present

## 2023-04-23 DIAGNOSIS — Z975 Presence of (intrauterine) contraceptive device: Secondary | ICD-10-CM | POA: Diagnosis not present

## 2023-04-23 DIAGNOSIS — Z0000 Encounter for general adult medical examination without abnormal findings: Secondary | ICD-10-CM

## 2023-04-23 DIAGNOSIS — G479 Sleep disorder, unspecified: Secondary | ICD-10-CM | POA: Insufficient documentation

## 2023-04-23 DIAGNOSIS — J45990 Exercise induced bronchospasm: Secondary | ICD-10-CM

## 2023-04-23 DIAGNOSIS — Z6833 Body mass index (BMI) 33.0-33.9, adult: Secondary | ICD-10-CM

## 2023-04-23 DIAGNOSIS — E6609 Other obesity due to excess calories: Secondary | ICD-10-CM | POA: Diagnosis not present

## 2023-04-23 DIAGNOSIS — E66811 Obesity, class 1: Secondary | ICD-10-CM | POA: Diagnosis not present

## 2023-04-23 DIAGNOSIS — Z13220 Encounter for screening for lipoid disorders: Secondary | ICD-10-CM

## 2023-04-23 DIAGNOSIS — R5383 Other fatigue: Secondary | ICD-10-CM | POA: Diagnosis not present

## 2023-04-23 DIAGNOSIS — R7989 Other specified abnormal findings of blood chemistry: Secondary | ICD-10-CM

## 2023-04-23 DIAGNOSIS — D72829 Elevated white blood cell count, unspecified: Secondary | ICD-10-CM

## 2023-04-23 LAB — CBC
HCT: 41.7 % (ref 36.0–46.0)
Hemoglobin: 14.8 g/dL (ref 12.0–15.0)
MCHC: 35.5 g/dL (ref 30.0–36.0)
MCV: 90.5 fL (ref 78.0–100.0)
Platelets: 207 10*3/uL (ref 150.0–400.0)
RBC: 4.6 Mil/uL (ref 3.87–5.11)
RDW: 12.6 % (ref 11.5–15.5)
WBC: 11.8 10*3/uL — ABNORMAL HIGH (ref 4.0–10.5)

## 2023-04-23 LAB — LIPID PANEL
Cholesterol: 136 mg/dL (ref 0–200)
HDL: 48.7 mg/dL (ref >39.00)
LDL Cholesterol: 75 mg/dL (ref 0–99)
NonHDL: 86.99
Total CHOL/HDL Ratio: 3
Triglycerides: 61 mg/dL (ref 0.0–149.0)
VLDL: 12.2 mg/dL (ref 0.0–40.0)

## 2023-04-23 LAB — BASIC METABOLIC PANEL
BUN: 18 mg/dL (ref 6–23)
CO2: 29 meq/L (ref 19–32)
Calcium: 9.2 mg/dL (ref 8.4–10.5)
Chloride: 102 meq/L (ref 96–112)
Creatinine, Ser: 0.88 mg/dL (ref 0.40–1.20)
GFR: 87.06 mL/min (ref >60.00)
Glucose, Bld: 108 mg/dL — ABNORMAL HIGH (ref 70–99)
Potassium: 3.8 meq/L (ref 3.5–5.1)
Sodium: 139 meq/L (ref 135–145)

## 2023-04-23 LAB — TSH: TSH: 1.04 u[IU]/mL (ref 0.35–5.50)

## 2023-04-23 LAB — VITAMIN B12: Vitamin B-12: 281 pg/mL (ref 211–911)

## 2023-04-23 NOTE — Progress Notes (Signed)
Subjective:  Patient ID: Erin Whitney, female    DOB: 12-17-1990  Age: 32 y.o. MRN: [id number]  Patient Care Team: Mort Sawyers, FNP as PCP - General (Family Medicine)   CC:  Chief Complaint  Patient presents with   Annual Exam    HPI Erin Whitney is a 32 y.o. female who presents today for an annual physical exam. She reports consuming a general diet. The patient does not participate in regular exercise at present. She generally feels well. She reports sleeping poorly. She does have additional problems to discuss today.   Sleeps well from 9 pm to 12 am but then after that wakes up frequently and or can't go back to sleep. She has not been told that she snores. Does feel excessively tired in the daytime.   Vision:Within last year Dental:Receives regular dental care Goes for annual skin screening, FMH melanoma   Elevated LFTS: taken off of ocp and seems to have rectified however she states that she did not f/u with Gi as she was supposed to last year. Last visit was 09/2021. Suspected NASH per note with GI. Ruq abdominal u/s with fatty liver, negative hepatitis panel.   Last pap: 03/25/21 negative pap  Bone density scan: IUD paraguard placed in April 2024.   Pt is with acute concerns.       Advanced Directives Patient does not have advanced directives.   DEPRESSION SCREENING    09/02/2021    3:17 PM  PHQ 2/9 Scores  PHQ - 2 Score 0  PHQ- 9 Score 4     ROS: Negative unless specifically indicated above in HPI.    Current Outpatient Medications:    albuterol (VENTOLIN HFA) 108 (90 Base) MCG/ACT inhaler, Inhale 2 puffs into the lungs every 6 (six) hours as needed for wheezing or shortness of breath., Disp: 18 g, Rfl: 1   Betamethasone Valerate (LUXIQ) 0.12 % foam, Apply 1-2 times daily to affected areas on scalp as needed for itching., Disp: 100 g, Rfl: 2   Cetirizine HCl (ZYRTEC ALLERGY) 10 MG CAPS, Take 10 mg by mouth at bedtime., Disp: , Rfl:    Multiple Vitamin  (MULTIVITAMIN) capsule, Take 1 capsule by mouth daily., Disp: , Rfl:     Objective:    BP 138/82 (BP Location: Left Arm, Patient Position: Sitting, Cuff Size: Normal)   Pulse (!) 120   Temp (!) 97.1 F (36.2 C) (Temporal)   Ht 5\' 10"  (1.778 m)   Wt 230 lb (104.3 kg)   LMP 04/06/2023   SpO2 99%   BMI 33.00 kg/m   BP Readings from Last 3 Encounters:  04/23/23 138/82  01/27/23 (!) 158/91  08/19/22 (!) 140/84      Physical Exam Constitutional:      General: She is not in acute distress.    Appearance: Normal appearance. She is obese. She is not ill-appearing.  HENT:     Head: Normocephalic.     Right Ear: Tympanic membrane normal.     Left Ear: Tympanic membrane normal.     Nose: Nose normal.     Mouth/Throat:     Mouth: Mucous membranes are moist.  Eyes:     Extraocular Movements: Extraocular movements intact.     Pupils: Pupils are equal, round, and reactive to light.  Cardiovascular:     Rate and Rhythm: Normal rate and regular rhythm.  Pulmonary:     Effort: Pulmonary effort is normal.     Breath sounds: Normal breath  sounds.  Abdominal:     General: Abdomen is flat. Bowel sounds are normal.     Palpations: Abdomen is soft.     Tenderness: There is no guarding or rebound.  Musculoskeletal:        General: Normal range of motion.     Cervical back: Normal range of motion.  Skin:    General: Skin is warm.     Capillary Refill: Capillary refill takes less than 2 seconds.  Neurological:     General: No focal deficit present.     Mental Status: She is alert.  Psychiatric:        Mood and Affect: Mood normal.        Behavior: Behavior normal.        Thought Content: Thought content normal.        Judgment: Judgment normal.          Assessment & Plan:  IUD (intrauterine device) in place  Encounter for general adult medical examination without abnormal findings Assessment & Plan: Patient Counseling(The following topics were reviewed):  Preventative  care handout given to pt  Health maintenance and immunizations reviewed. Please refer to Health maintenance section. Pt advised on safe sex, wearing seatbelts in car, and proper nutrition labwork ordered today for annual Dental health: Discussed importance of regular tooth brushing, flossing, and dental visits.   Orders: -     Lipid panel -     Basic metabolic panel -     CBC  Screening for lipoid disorders -     Lipid panel  Class 1 obesity due to excess calories without serious comorbidity with body mass index (BMI) of 33.0 to 33.9 in adult Assessment & Plan: Pt advised to work on diet and exercise as tolerated The beneficiary does not have any FDA labeled contraindications to the requested agent including pregnancy, lactation, h/o medullary thyroid cancer or multiple endocrine neoplasia type II.    Orders: -     TSH -     Vitamin B12  Other fatigue -     TSH -     Vitamin B12  Sleep disorder Assessment & Plan: R/o sleep apnea. Ordering sleep study.    Orders: -     Ambulatory referral to Sleep Studies  Exercise-induced asthma Assessment & Plan: Stable Albuterol prn   Seborrheic dermatitis of scalp Assessment & Plan: Stable followed by dermatology        Follow-up: Return in about 1 year (around 04/22/2024) for f/u CPE.   Mort Sawyers, FNP

## 2023-04-23 NOTE — Assessment & Plan Note (Signed)

## 2023-04-23 NOTE — Assessment & Plan Note (Signed)
R/o sleep apnea. Ordering sleep study.

## 2023-04-23 NOTE — Patient Instructions (Signed)
  Stop by the lab prior to leaving today. I will notify you of your results once received.   Recommendations on keeping yourself healthy:  - Exercise at least 30-45 minutes a day, 3-4 days a week.  - Eat a low-fat diet with lots of fruits and vegetables, up to 7-9 servings per day.  - Seatbelts can save your life. Wear them always.  - Smoke detectors on every level of your home, check batteries every year.  - Eye Doctor - have an eye exam every 1-2 years  - Safe sex - if you may be exposed to STDs, use a condom.  - Alcohol -  If you drink, do it moderately, less than 2 drinks per day.  - Health Care Power of Attorney. Choose someone to speak for you if you are not able.  - Depression is common in our stressful world.If you're feeling down or losing interest in things you normally enjoy, please come in for a visit.  - Violence - If anyone is threatening or hurting you, please call immediately.  Due to recent changes in healthcare laws, you may see results of your imaging and/or laboratory studies on MyChart before I have had a chance to review them.  I understand that in some cases there may be results that are confusing or concerning to you. Please understand that not all results are received at the same time and often I may need to interpret multiple results in order to provide you with the best plan of care or course of treatment. Therefore, I ask that you please give me 2 business days to thoroughly review all your results before contacting my office for clarification. Should we see a critical lab result, you will be contacted sooner.   I will see you again in one year for your annual comprehensive exam unless otherwise stated and or with acute concerns.  It was a pleasure seeing you today! Please do not hesitate to reach out with any questions and or concerns.  Regards,   Yarah Fuente    

## 2023-04-23 NOTE — Assessment & Plan Note (Signed)
 Pt advised to work on diet and exercise as tolerated The beneficiary does not have any FDA labeled contraindications to the requested agent including pregnancy, lactation, h/o medullary thyroid cancer or multiple endocrine neoplasia type II.

## 2023-04-23 NOTE — Assessment & Plan Note (Signed)
Stable  Albuterol prn

## 2023-04-23 NOTE — Addendum Note (Signed)
Addended by: Sumner Boast on: 04/23/2023 05:11 PM   Modules accepted: Orders

## 2023-04-23 NOTE — Assessment & Plan Note (Signed)
Stable followed by dermatology

## 2023-04-23 NOTE — Progress Notes (Signed)
Are we able to add hepatic function panel? Orders in

## 2023-04-24 LAB — HEPATIC FUNCTION PANEL
AG Ratio: 1.6 (calc) (ref 1.0–2.5)
ALT: 21 U/L (ref 6–29)
AST: 18 U/L (ref 10–30)
Albumin: 4.4 g/dL (ref 3.6–5.1)
Alkaline phosphatase (APISO): 68 U/L (ref 31–125)
Bilirubin, Direct: 0.1 mg/dL (ref 0.0–0.2)
Globulin: 2.8 g/dL (ref 1.9–3.7)
Indirect Bilirubin: 0.4 mg/dL (ref 0.2–1.2)
Total Bilirubin: 0.5 mg/dL (ref 0.2–1.2)
Total Protein: 7.2 g/dL (ref 6.1–8.1)

## 2023-06-04 ENCOUNTER — Other Ambulatory Visit (INDEPENDENT_AMBULATORY_CARE_PROVIDER_SITE_OTHER): Payer: 59

## 2023-06-04 DIAGNOSIS — D72829 Elevated white blood cell count, unspecified: Secondary | ICD-10-CM | POA: Diagnosis not present

## 2023-06-04 LAB — CBC WITH DIFFERENTIAL/PLATELET
Basophils Absolute: 0 10*3/uL (ref 0.0–0.1)
Basophils Relative: 0.6 % (ref 0.0–3.0)
Eosinophils Absolute: 0.1 10*3/uL (ref 0.0–0.7)
Eosinophils Relative: 1.3 % (ref 0.0–5.0)
HCT: 41.2 % (ref 36.0–46.0)
Hemoglobin: 13.8 g/dL (ref 12.0–15.0)
Lymphocytes Relative: 24.4 % (ref 12.0–46.0)
Lymphs Abs: 1.6 10*3/uL (ref 0.7–4.0)
MCHC: 33.4 g/dL (ref 30.0–36.0)
MCV: 89.6 fL (ref 78.0–100.0)
Monocytes Absolute: 0.6 10*3/uL (ref 0.1–1.0)
Monocytes Relative: 8.9 % (ref 3.0–12.0)
Neutro Abs: 4.3 10*3/uL (ref 1.4–7.7)
Neutrophils Relative %: 64.8 % (ref 43.0–77.0)
Platelets: 204 10*3/uL (ref 150.0–400.0)
RBC: 4.6 Mil/uL (ref 3.87–5.11)
RDW: 13 % (ref 11.5–15.5)
WBC: 6.6 10*3/uL (ref 4.0–10.5)

## 2023-06-07 ENCOUNTER — Encounter: Payer: Self-pay | Admitting: Family

## 2023-06-14 ENCOUNTER — Ambulatory Visit: Payer: Self-pay | Admitting: Family

## 2023-06-14 ENCOUNTER — Telehealth: Payer: Self-pay | Admitting: Family

## 2023-06-14 NOTE — Telephone Encounter (Signed)
LVM for patient to callback in to be triaged. Patient scheduled an appointment pain under her left arm with some chest and back. Concerns regarding the chest pain. Patient needs to be triaged.

## 2023-06-14 NOTE — Telephone Encounter (Signed)
Noted

## 2023-06-14 NOTE — Telephone Encounter (Signed)
Copied from CRM [id number]. Topic: Clinical - Red Word Triage >> Jun 14, 2023 11:46 AM Isabell A wrote: [facility] that prompted transfer to Nurse Triage: Returning call back to be triaged for pain in back/chest - patient states she may have pulled something in shoulder or arm.   Chief Complaint: R armpit pain Symptoms: R armpit pain, sometimes feels like pain in the breast as well Frequency: Ongoing for 2 weeks Disposition: [] ED /[] Urgent Care (no appt availability in office) / [x] Appointment(In office/virtual)/ []  [facility]/ [] Home Care/ [] Refused Recommended Disposition /[]  Mobile Bus/ []  Follow-up with PCP Additional Notes: Patient stated she has been having pain in right armpit for a couple weeks. She describes it as a sore feeling, pain level 3/10. She also feels like she has pain in the breast at times. Patient denies any other symptoms. Appointment scheduled for tomorrow.   Reason for Disposition  [1] MILD pain (e.g., does not interfere with normal activities) AND [2] present > 7 days  Answer Assessment - Initial Assessment Questions 1. ONSET: "When did the pain start?"     2 weeks ago   2. LOCATION: "Where is the pain located?"     Right armpit, sometimes feels like pain in breast  3. PAIN: "How bad is the pain?" (Scale 1-10; or mild, moderate, severe)   - MILD (1-3): Doesn't interfere with normal activities.   - MODERATE (4-7): Interferes with normal activities (e.g., work or school) or awakens from sleep.   - SEVERE (8-10): Excruciating pain, unable to do any normal activities, unable to hold a cup of water.     Just soreness, 3/10   4. CAUSE: "What do you think is causing the arm pain?"     Unknown   5. OTHER SYMPTOMS: "Do you have any other symptoms?" (e.g., neck pain, swelling, rash, fever, numbness, weakness)     No  Protocols used: Arm Pain-A-AH

## 2023-06-15 ENCOUNTER — Encounter: Payer: Self-pay | Admitting: Family

## 2023-06-15 ENCOUNTER — Ambulatory Visit: Payer: 59 | Admitting: Family

## 2023-06-15 VITALS — BP 122/80 | HR 97 | Temp 98.0°F | Ht 70.0 in | Wt 229.0 lb

## 2023-06-15 DIAGNOSIS — N6311 Unspecified lump in the right breast, upper outer quadrant: Secondary | ICD-10-CM | POA: Insufficient documentation

## 2023-06-15 NOTE — Patient Instructions (Signed)
I have sent an electronic order over to your preferred location for the following:   []   Right diagnostic breast mammogram and ultrasound    Please give this center a call to get scheduled at your convenience.   [x]   [facility]      [address] [place], [place]        [phone]         Make sure to wear two piece  clothing  No lotions powders or deodorants the day of the appointment Make sure to bring picture ID and insurance card.  Bring list of medications you are currently taking including any supplements.

## 2023-06-15 NOTE — Progress Notes (Signed)
   Established Patient Office Visit  Subjective:   Patient ID: LASONIA CASINO, female    DOB: 1990-07-15  Age: 33 y.o. MRN: [id number]  CC:  Chief Complaint  Patient presents with   Pain    Right arm pit x 2 weeks with some breast pain later in the day.     HPI: Erin Whitney is a 33 y.o. female presenting on 06/15/2023 for Pain (Right arm pit x 2 weeks with some breast pain later in the day. )  Right armpit pain for the last few weeks, when she palpates it she can feel the tenderness, she does not palpate a lump. Pain level 3/10. Does feel pain in breast at times as well. The breast pain is tender on the right outer side and into the inner medial breast. No nipple discharge, no rash, no inverted nipple.  Menses was January 13-18 no improvement with cessation of menses No chance of pregnancy. Recent menses and IUD in place.         ROS: Negative unless specifically indicated above in HPI.   Relevant past medical history reviewed and updated as indicated.   Allergies and medications reviewed and updated.   Current Outpatient Medications:    albuterol (VENTOLIN HFA) 108 (90 Base) MCG/ACT inhaler, Inhale 2 puffs into the lungs every 6 (six) hours as needed for wheezing or shortness of breath., Disp: 18 g, Rfl: 1   Cetirizine HCl (ZYRTEC ALLERGY) 10 MG CAPS, Take 10 mg by mouth at bedtime., Disp: , Rfl:    cyanocobalamin (VITAMIN B12) 1000 MCG tablet, Take 1,000 mcg by mouth daily., Disp: , Rfl:    Multiple Vitamin (MULTIVITAMIN) capsule, Take 1 capsule by mouth daily., Disp: , Rfl:   No Known Allergies  Objective:   BP 122/80   Pulse 97   Temp 98 F (36.7 C) (Temporal)   Ht 5\' 10"  (1.778 m)   Wt 229 lb (103.9 kg)   LMP 05/31/2023   SpO2 97%   BMI 32.86 kg/m    Physical Exam Chest:  Breasts:    Right: Mass (ruo and right middle outer) present.    Lymphadenopathy:     Upper Body:     Right upper body: Axillary adenopathy (tenderness on palpation) present.      Assessment & Plan:  Mass of upper outer quadrant of right breast Assessment & Plan: Diag mammo and u/s right breast Suspect density, fatty cyst, fibroadenoma Low suspicion for neoplasm but imaging to assess  Orders: -     [place] LIMITED ULTRASOUND INCLUDING AXILLA RIGHT BREAST; Future -     MM 3D DIAGNOSTIC MAMMOGRAM BILATERAL BREAST; Future     Follow up plan: Return if symptoms worsen or fail to improve.  Mort Sawyers, FNP

## 2023-06-15 NOTE — Assessment & Plan Note (Signed)
Diag mammo and u/s right breast Suspect density, fatty cyst, fibroadenoma Low suspicion for neoplasm but imaging to assess

## 2023-06-16 ENCOUNTER — Ambulatory Visit: Payer: 59 | Admitting: Family

## 2023-06-16 ENCOUNTER — Ambulatory Visit
Admission: RE | Admit: 2023-06-16 | Discharge: 2023-06-16 | Disposition: A | Discharge status: Home / Self Care | Payer: 59 | Source: Ambulatory Visit | Attending: Family | Admitting: Family

## 2023-06-16 ENCOUNTER — Ambulatory Visit
Admission: RE | Admit: 2023-06-16 | Discharge: 2023-06-16 | Discharge status: Home / Self Care | Payer: 59 | Source: Ambulatory Visit | Attending: Family

## 2023-06-16 ENCOUNTER — Other Ambulatory Visit: Payer: Self-pay | Admitting: Family

## 2023-06-16 DIAGNOSIS — N6311 Unspecified lump in the right breast, upper outer quadrant: Secondary | ICD-10-CM

## 2023-06-16 DIAGNOSIS — N6310 Unspecified lump in the right breast, unspecified quadrant: Secondary | ICD-10-CM

## 2023-06-17 ENCOUNTER — Encounter: Payer: Self-pay | Admitting: Family

## 2023-06-17 NOTE — Progress Notes (Signed)
noted

## 2023-06-22 ENCOUNTER — Ambulatory Visit
Admission: RE | Admit: 2023-06-22 | Discharge: 2023-06-22 | Disposition: A | Discharge status: Home / Self Care | Payer: 59 | Source: Ambulatory Visit | Attending: Family | Admitting: Family

## 2023-06-22 DIAGNOSIS — N6310 Unspecified lump in the right breast, unspecified quadrant: Secondary | ICD-10-CM

## 2023-06-22 HISTORY — PX: BREAST BIOPSY: SHX20

## 2023-06-23 LAB — SURGICAL PATHOLOGY

## 2023-06-23 NOTE — Progress Notes (Signed)
 noted

## 2023-10-21 ENCOUNTER — Telehealth: Admitting: Family

## 2023-10-21 VITALS — BP 134/84 | HR 78 | Temp 98.6°F | Ht 70.0 in | Wt 224.0 lb

## 2023-10-21 DIAGNOSIS — R051 Acute cough: Secondary | ICD-10-CM

## 2023-10-21 DIAGNOSIS — J0100 Acute maxillary sinusitis, unspecified: Secondary | ICD-10-CM | POA: Diagnosis not present

## 2023-10-21 MED ORDER — GUAIFENESIN-CODEINE 100-10 MG/5ML PO SOLN: 5.0000 mL | Freq: Three times a day (TID) | ORAL | 0 refills | Status: AC | PRN

## 2023-10-21 MED ORDER — AMOXICILLIN-POT CLAVULANATE 875-125 MG PO TABS: 1.0000 | ORAL_TABLET | Freq: Two times a day (BID) | ORAL | 0 refills | Status: DC

## 2023-10-21 NOTE — Addendum Note (Signed)
 Addended by: Felicita Horns on: 10/21/2023 09:11 AM   Modules accepted: Level of Service

## 2023-10-21 NOTE — Assessment & Plan Note (Addendum)
 Prescription given for augmentin 875/125 mg po bid for ten days. Pt to continue tylenol /ibuprofen  prn sinus pain. Continue with humidifier prn and steam showers recommended as well. instructed If no symptom improvement in 48 hours please f/u  Pdmp reviewed  Rx sent for guai codeine syrup  Advised of sedative precautions.  Cont zyrtec start flonase daily  Mucinex prn  Tylenol  prn   Ibuprofen  would be helpful for the sinus pain

## 2023-10-21 NOTE — Progress Notes (Signed)
 Virtual Visit via Video note  I connected with Erin Whitney on 10/21/23 at the office by video and verified that I am speaking with the correct person using two identifiers.The provider, Felicita Horns, FNP is located in their home at time of visit.  I discussed the limitations, risks, security and privacy concerns of performing an evaluation and management service by video and the availability of in person appointments. I also discussed with the patient that there may be a patient responsible charge related to this service. The patient expressed understanding and agreed to proceed.  Subjective: PCP: Felicita Horns, FNP  Chief Complaint  Patient presents with   Acute Visit    Cough, sinus pressure and congestion    HPI  Four days ago started with allergy symptoms and then the next day progressed to sinus drainage and pressure and then has started a dry cough.  She also has some sinus pressure that is worse with bending over.  She felt her teeth were pretty sore as well, yesterday woke up with a  low grade fever so she took some tylenol . No sore throat but no ear pain.   For allergies she is taking zyrtec nightly.  Will occasionally take flonase in the am but hasn't been taking this daily.  She did start mucinex which has helped slightly.        ROS: Per HPI  Current Outpatient Medications:    albuterol  (VENTOLIN  HFA) 108 (90 Base) MCG/ACT inhaler, Inhale 2 puffs into the lungs every 6 (six) hours as needed for wheezing or shortness of breath., Disp: 18 g, Rfl: 1   amoxicillin -clavulanate (AUGMENTIN) 875-125 MG tablet, Take 1 tablet by mouth 2 (two) times daily., Disp: 20 tablet, Rfl: 0   Cetirizine HCl (ZYRTEC ALLERGY) 10 MG CAPS, Take 10 mg by mouth at bedtime., Disp: , Rfl:    cyanocobalamin  (VITAMIN B12) 1000 MCG tablet, Take 1,000 mcg by mouth daily., Disp: , Rfl:    guaiFENesin-codeine 100-10 MG/5ML syrup, Take 5 mLs by mouth 3 (three) times daily as needed for up to 5  days., Disp: 75 mL, Rfl: 0   Multiple Vitamin (MULTIVITAMIN) capsule, Take 1 capsule by mouth daily., Disp: , Rfl:   Observations/Objective: Physical Exam Constitutional:      General: She is not in acute distress.    Appearance: Normal appearance. She is not ill-appearing.  Pulmonary:     Effort: Pulmonary effort is normal.  Neurological:     General: No focal deficit present.     Mental Status: She is alert and oriented to person, place, and time.  Psychiatric:        Mood and Affect: Mood normal.        Behavior: Behavior normal.        Thought Content: Thought content normal.     Assessment and Plan: Acute non-recurrent maxillary sinusitis Assessment & Plan: Prescription given for augmentin 875/125 mg po bid for ten days. Pt to continue tylenol /ibuprofen  prn sinus pain. Continue with humidifier prn and steam showers recommended as well. instructed If no symptom improvement in 48 hours please f/u  Pdmp reviewed  Rx sent for guai codeine syrup  Advised of sedative precautions.  Cont zyrtec start flonase daily  Mucinex prn  Tylenol  prn   Ibuprofen  would be helpful for the sinus pain   Orders: -     Amoxicillin -Pot Clavulanate; Take 1 tablet by mouth 2 (two) times daily.  Dispense: 20 tablet; Refill: 0  Acute cough -  guaiFENesin-Codeine; Take 5 mLs by mouth 3 (three) times daily as needed for up to 5 days.  Dispense: 75 mL; Refill: 0    Follow Up Instructions: Return if symptoms worsen or fail to improve.   I discussed the assessment and treatment plan with the patient. The patient was provided an opportunity to ask questions and all were answered. The patient agreed with the plan and demonstrated an understanding of the instructions.   The patient was advised to call back or seek an in-person evaluation if the symptoms worsen or if the condition fails to improve as anticipated.  The above assessment and management plan was discussed with the patient. The patient  verbalized understanding of and has agreed to the management plan. Patient is aware to call the clinic if symptoms persist or worsen. Patient is aware when to return to the clinic for a follow-up visit. Patient educated on when it is appropriate to go to the emergency department.     Felicita Horns, MSN, APRN, FNP-C [facility]

## 2023-11-15 ENCOUNTER — Ambulatory Visit

## 2023-11-15 DIAGNOSIS — Z3201 Encounter for pregnancy test, result positive: Secondary | ICD-10-CM | POA: Diagnosis not present

## 2023-11-15 LAB — POCT URINE PREGNANCY: Preg Test, Ur: POSITIVE — AB

## 2023-11-15 NOTE — Progress Notes (Signed)
 [name] here for a UPT. Patient has a IUD and has had a Positive upt at home.   LMP is 10/07/23.     UPT in office Positive.  Pt returning in 48 hrs for repeat Labs. Precautions discussed with patients regarding signs of ectopic pregnancy

## 2023-11-16 ENCOUNTER — Ambulatory Visit: Payer: Self-pay | Admitting: Obstetrics & Gynecology

## 2023-11-16 LAB — BETA HCG QUANT (REF LAB): hCG Quant: 2086 m[IU]/mL

## 2023-11-17 ENCOUNTER — Other Ambulatory Visit

## 2023-11-17 DIAGNOSIS — Z3201 Encounter for pregnancy test, result positive: Secondary | ICD-10-CM

## 2023-11-18 ENCOUNTER — Ambulatory Visit: Payer: Self-pay | Admitting: Obstetrics & Gynecology

## 2023-11-18 LAB — BETA HCG QUANT (REF LAB): hCG Quant: 3779 m[IU]/mL

## 2023-11-30 ENCOUNTER — Ambulatory Visit (INDEPENDENT_AMBULATORY_CARE_PROVIDER_SITE_OTHER): Admitting: Obstetrics & Gynecology

## 2023-11-30 ENCOUNTER — Other Ambulatory Visit (INDEPENDENT_AMBULATORY_CARE_PROVIDER_SITE_OTHER)

## 2023-11-30 VITALS — BP 113/80 | HR 91 | Wt 227.0 lb

## 2023-11-30 DIAGNOSIS — O3680X0 Pregnancy with inconclusive fetal viability, not applicable or unspecified: Secondary | ICD-10-CM

## 2023-11-30 DIAGNOSIS — Z30432 Encounter for removal of intrauterine contraceptive device: Secondary | ICD-10-CM

## 2023-11-30 DIAGNOSIS — Z3481 Encounter for supervision of other normal pregnancy, first trimester: Secondary | ICD-10-CM | POA: Diagnosis not present

## 2023-11-30 DIAGNOSIS — O09891 Supervision of other high risk pregnancies, first trimester: Secondary | ICD-10-CM

## 2023-11-30 DIAGNOSIS — Z331 Pregnant state, incidental: Secondary | ICD-10-CM

## 2023-11-30 DIAGNOSIS — T8331XA Breakdown (mechanical) of intrauterine contraceptive device, initial encounter: Secondary | ICD-10-CM

## 2023-11-30 DIAGNOSIS — Z3480 Encounter for supervision of other normal pregnancy, unspecified trimester: Secondary | ICD-10-CM

## 2023-11-30 DIAGNOSIS — Z3A01 Less than 8 weeks gestation of pregnancy: Secondary | ICD-10-CM | POA: Diagnosis not present

## 2023-11-30 NOTE — Progress Notes (Signed)
    GYNECOLOGY OFFICE PROCEDURE NOTE  Erin Whitney is a 33 y.o. G2P1001 at [redacted]w[redacted]d here for Paragard  IUD removal due to recent diagnosis of IUP.  Viable IUP confirmed on ultrasound today, IUD visualized below pregnancy.   IUD Removal  Patient identified, informed consent performed, consent signed.   Chaperone present.  Patient was placed in the dorsal lithotomy position, normal external genitalia was noted.  A speculum was placed in the patient's vagina, normal discharge was noted, no lesions. The cervix was visualized, no lesions, no abnormal discharge.  The strings of the IUD were visualized, grasped and pulled using ring forceps. The Paragard  IUD was removed in its entirety. No bleeding noted immediately afterwards. No disruption of pregnancy noted on ultrasound.  Patient tolerated the procedure well.    Patient will continue prenatal care as recommended.   GLORIS HUGGER, MD, FACOG Obstetrician & Gynecologist, [facility] for [employer], [facility]

## 2023-11-30 NOTE — Progress Notes (Signed)

## 2023-12-16 ENCOUNTER — Ambulatory Visit (INDEPENDENT_AMBULATORY_CARE_PROVIDER_SITE_OTHER): Admitting: Family Medicine

## 2023-12-16 ENCOUNTER — Other Ambulatory Visit (HOSPITAL_COMMUNITY)
Admission: RE | Admit: 2023-12-16 | Discharge: 2023-12-16 | Disposition: A | Discharge status: Home / Self Care | Source: Ambulatory Visit | Attending: Family Medicine | Admitting: Family Medicine

## 2023-12-16 ENCOUNTER — Telehealth: Payer: Self-pay

## 2023-12-16 VITALS — BP 124/85 | HR 123 | Wt 225.0 lb

## 2023-12-16 DIAGNOSIS — O200 Threatened abortion: Secondary | ICD-10-CM | POA: Diagnosis not present

## 2023-12-16 DIAGNOSIS — Z3480 Encounter for supervision of other normal pregnancy, unspecified trimester: Secondary | ICD-10-CM | POA: Diagnosis present

## 2023-12-16 DIAGNOSIS — Z3A10 10 weeks gestation of pregnancy: Secondary | ICD-10-CM

## 2023-12-16 DIAGNOSIS — Z113 Encounter for screening for infections with a predominantly sexual mode of transmission: Secondary | ICD-10-CM | POA: Diagnosis present

## 2023-12-16 NOTE — Progress Notes (Signed)
    Subjective:    Patient ID: Erin Whitney is a 33 y.o. female presenting with Routine Prenatal Visit  on 12/16/2023  HPI: Pt. Is a G2P1001 @ [redacted]w[redacted]d with brown discharge and spotting. Has been spotting on and off since then. Yesterday afternoon and since then.  Review of Systems  Constitutional:  Negative for chills and fever.  Respiratory:  Negative for shortness of breath.   Cardiovascular:  Negative for chest pain.  Gastrointestinal:  Negative for abdominal pain, nausea and vomiting.  Genitourinary:  Negative for dysuria.  Skin:  Negative for rash.      Objective:    BP 124/85   Pulse (!) 123   Wt 225 lb (102.1 kg)   LMP 10/07/2023 (Exact Date)   BMI 32.28 kg/m  Physical Exam Exam conducted with a chaperone present.  Constitutional:      General: She is not in acute distress.    Appearance: She is well-developed.  HENT:     Head: Normocephalic and atraumatic.  Eyes:     General: No scleral icterus. Cardiovascular:     Rate and Rhythm: Normal rate.  Pulmonary:     Effort: Pulmonary effort is normal.  Abdominal:     Palpations: Abdomen is soft.  Musculoskeletal:     Cervical back: Neck supple.  Skin:    General: Skin is warm and dry.  Neurological:     Mental Status: She is alert and oriented to person, place, and time.   + FHTs on doppler today      Assessment & Plan:   Problem List Items Addressed This Visit       Unprioritized   Threatened abortion - Primary   Discussed pelvic rest. Warning signs. She is not having any pain at present. Consider repeat u/s with New OB next week. Reassurance given that with + FHR, that is a good sign. Having n/v and severe fatigue, also good signs. Check wet prep and urine culture.      Relevant Orders   Culture, OB Urine   Cervicovaginal ancillary only   Other Visit Diagnoses       [redacted] weeks gestation of pregnancy            Return in about 1 week (around 12/23/2023) for New OB visit.  Erin GORMAN Birk,  MD 12/16/2023 10:22 AM

## 2023-12-16 NOTE — Telephone Encounter (Signed)
 Pt calling stating that she is having some brownish discharge on the toilet paper when she wipes, Pt also thinks that she may have a yeast infection.  Pt is 10w. Please advise if Pt should be added to provider schedule or is okay for Nurse visit/ swab

## 2023-12-16 NOTE — Progress Notes (Signed)
 CC: Having brownish colored discharge   Also thinks that she has a yeast infection

## 2023-12-16 NOTE — Assessment & Plan Note (Signed)
 Discussed pelvic rest. Warning signs. She is not having any pain at present. Consider repeat u/s with New OB next week. Reassurance given that with + FHR, that is a good sign. Having n/v and severe fatigue, also good signs. Check wet prep and urine culture.

## 2023-12-17 LAB — CERVICOVAGINAL ANCILLARY ONLY
Bacterial Vaginitis (gardnerella): NEGATIVE
Candida Glabrata: NEGATIVE
Candida Vaginitis: POSITIVE — AB
Chlamydia: NEGATIVE
Comment: NEGATIVE
Comment: NEGATIVE
Comment: NEGATIVE
Comment: NEGATIVE
Comment: NEGATIVE
Comment: NORMAL
Neisseria Gonorrhea: NEGATIVE
Trichomonas: NEGATIVE

## 2023-12-18 LAB — CULTURE, OB URINE

## 2023-12-18 LAB — URINE CULTURE, OB REFLEX

## 2023-12-20 ENCOUNTER — Ambulatory Visit: Payer: Self-pay | Admitting: Family Medicine

## 2023-12-20 MED ORDER — TERCONAZOLE 0.4 % VA CREA
1.0000 | TOPICAL_CREAM | Freq: Every day | VAGINAL | 0 refills | Status: DC
Start: 2023-12-20 — End: 2024-01-18

## 2023-12-21 ENCOUNTER — Ambulatory Visit (INDEPENDENT_AMBULATORY_CARE_PROVIDER_SITE_OTHER): Admitting: Obstetrics & Gynecology

## 2023-12-21 VITALS — BP 126/80 | HR 93 | Wt 227.0 lb

## 2023-12-21 DIAGNOSIS — Z8759 Personal history of other complications of pregnancy, childbirth and the puerperium: Secondary | ICD-10-CM | POA: Diagnosis not present

## 2023-12-21 DIAGNOSIS — Z3481 Encounter for supervision of other normal pregnancy, first trimester: Secondary | ICD-10-CM | POA: Diagnosis not present

## 2023-12-21 DIAGNOSIS — Z3A10 10 weeks gestation of pregnancy: Secondary | ICD-10-CM | POA: Diagnosis not present

## 2023-12-21 DIAGNOSIS — Z3480 Encounter for supervision of other normal pregnancy, unspecified trimester: Secondary | ICD-10-CM | POA: Insufficient documentation

## 2023-12-21 MED ORDER — ASPIRIN 81 MG PO TBEC: 81.0000 mg | DELAYED_RELEASE_TABLET | Freq: Every day | ORAL | 2 refills | Status: AC

## 2023-12-21 NOTE — Progress Notes (Signed)
 INITIAL PRENATAL VISIT  History:  Erin Whitney is a 33 y.o. G2P1001 at [redacted]w[redacted]d by LMP, early ultrasound being seen today for her first obstetrical visit.  Her obstetrical history is significant for term SVD in 2021 complicated by gestational hypertension.  Patient does intend to breast feed. Pregnancy history fully reviewed.  This pregnancy was conceived with IUD in place!  IUD successfully removed on 11/30/23.  Patient had some bleeding, diagnosed with threatened miscarriage on 12/16/23, pelvic rest recommeded.  No further bleeding since then.    Patient reports some gas pain, no other symptoms.  HISTORY: OB History  Gravida Para Term Preterm AB Living  2 1 1  0 0 1  SAB IAB Ectopic Multiple Live Births  0 0 0 0 1    # Outcome Date GA Lbr Len/2nd Weight Sex Type Anes PTL Lv  2 Current           1 Term 09/09/19 [redacted]w[redacted]d 13:59 / 01:15 7 lb 5.1 oz (3.32 kg) F Vag-Spont EPI  LIV     Birth Comments: WDL     Name: JOSSELYNE, ONOFRIO     Apgar1: 8  Apgar5: 9  Last pap smear was done 03/25/2021 and was normal with negative HRHPV.  Past Medical History:  Diagnosis Date   Asthma in adult 01/17/2013   History of gestational hypertension 07/30/2019   Ovarian cyst    Past Surgical History:  Procedure Laterality Date   BREAST BIOPSY Right 06/22/2023   US  RT BREAST BX W LOC DEV 1ST LESION IMG BX SPEC US  GUIDE 06/22/2023 [facility] MAMMOGRAPHY   WISDOM TOOTH EXTRACTION     Family History  Problem Relation Age of Onset   Liver disease Mother    Diabetes Mother    Heart disease Mother    Bladder Cancer Mother    Diabetes Father    Diabetes Maternal Grandmother    Cervical cancer Maternal Grandmother    Melanoma Paternal Grandfather        COD   Social History   Tobacco Use   Smoking status: Never   Smokeless tobacco: Never  Vaping Use   Vaping status: Never Used  Substance Use Topics   Alcohol use: Yes    Alcohol/week: 1.0 standard drink of alcohol    Types: 1 Cans of beer per week     Comment: social   Drug use: No   No Known Allergies Current Outpatient Medications on File Prior to Visit  Medication Sig Dispense Refill   albuterol  (VENTOLIN  HFA) 108 (90 Base) MCG/ACT inhaler Inhale 2 puffs into the lungs every 6 (six) hours as needed for wheezing or shortness of breath. 18 g 1   Cetirizine HCl (ZYRTEC ALLERGY) 10 MG CAPS Take 10 mg by mouth at bedtime.     cyanocobalamin  (VITAMIN B12) 1000 MCG tablet Take 1,000 mcg by mouth daily.     Multiple Vitamin (MULTIVITAMIN) capsule Take 1 capsule by mouth daily.     terconazole  (TERAZOL 7 ) 0.4 % vaginal cream Place 1 applicator vaginally at bedtime. 45 g 0   No current facility-administered medications on file prior to visit.   Review of Systems Pertinent items noted in HPI and remainder of comprehensive ROS otherwise negative.   Physical Exam:   Vitals:   12/21/23 0918  BP: 126/80  Pulse: 93  Weight: 227 lb (103 kg)   Fetal Heart Rate (bpm): 169   General: well-developed, well-nourished female in no acute distress  Breasts:  deferred  Skin: normal  coloration and turgor, no rashes  Neurologic: oriented, normal, negative, normal mood  Extremities: normal strength, tone, and muscle mass, ROM of all joints is normal  HEENT PERRLA, extraocular movement intact and sclera clear, anicteric  Neck supple and no masses  Cardiovascular: regular rate and rhythm  Respiratory:  no respiratory distress, normal breath sounds  Abdomen: soft, non-tender; bowel sounds normal; no masses,  no organomegaly  Pelvic: deferred   Assessment:  Pregnancy: G2P1001 Patient Active Problem List   Diagnosis Date Noted   Supervision of other normal pregnancy, antepartum 12/21/2023   History of gestational hypertension 12/21/2023   Threatened abortion 12/16/2023   Mass of upper outer quadrant of right breast 06/15/2023   Class 1 obesity due to excess calories without serious comorbidity with body mass index (BMI) of 33.0 to 33.9 in adult  04/23/2023   Sleep disorder 04/23/2023   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 History of gestational hypertension Will monitor BP closely. ASA prescribed for preeclampsia prophylaxis. - aspirin  EC 81 MG tablet; Take 1 tablet (81 mg total) by mouth at bedtime. Start taking when you are [redacted] weeks pregnant for rest of pregnancy for prevention of preeclampsia  Dispense: 300 tablet; Refill: 2  2. [redacted] weeks gestation of pregnancy 3. Supervision of other normal pregnancy, antepartum (Primary) - CBC/D/Plt+RPR+Rh+ABO+RubIgG... - Hemoglobin A1c - PANORAMA PRENATAL TEST - HORIZON CUSTOM - Comprehensive metabolic panel with GFR - TSH Rfx on Abnormal to Free T4 - aspirin  EC 81 MG tablet; Take 1 tablet (81 mg total) by mouth at bedtime. Start taking when you are [redacted] weeks pregnant for rest of pregnancy for prevention of preeclampsia  Dispense: 300 tablet; Refill: 2 - US  MFM OB DETAIL +14 WK; Future - Protein / creatinine ratio, urine   Initial labs drawn. Continue prenatal vitamins. Problem list reviewed and updated. Genetic Screening discussed, Panorama and Horizon: ordered. Ultrasound discussed; fetal anatomic survey: scheduled. Anticipatory guidance about prenatal visits given including labs, ultrasounds, and testing. Weight gain recommendations per IOM guidelines reviewed: underweight/BMI 18.5 or less > 28 - 40 lbs; normal weight/BMI 18.5 - 24.9 > 25 - 35 lbs; overweight/BMI 25 - 29.9 > 15 - 25 lbs; obese/BMI 30 or more > 11 - 20 lbs. Discussed usage of the Babyscripts app for more information about pregnancy, and to track blood pressures. Also discussed usage of virtual visits as additional source of managing and completing prenatal visits.  Patient was encouraged to use MyChart to review results, send requests, and have questions addressed.   The nature of [facility] with multiple MDs and Advanced  Practice Providers was explained to patient; also emphasized that residents, students are part of our team. Routine obstetric precautions reviewed. Encouraged to seek out care at our office or emergency room [facility] preferred) for urgent and/or emergent concerns. Return in about 4 weeks (around 01/18/2024) for OFFICE OB VISIT (MD or APP).     GLORIS HUGGER, MD, FACOG Obstetrician & Gynecologist, [facility] for [employer], [facility]

## 2023-12-22 ENCOUNTER — Ambulatory Visit: Payer: Self-pay | Admitting: Obstetrics & Gynecology

## 2023-12-22 ENCOUNTER — Encounter: Payer: Self-pay | Admitting: Obstetrics & Gynecology

## 2023-12-22 DIAGNOSIS — Z3480 Encounter for supervision of other normal pregnancy, unspecified trimester: Secondary | ICD-10-CM

## 2023-12-22 LAB — CBC/D/PLT+RPR+RH+ABO+RUBIGG...
Antibody Screen: NEGATIVE
Basophils Absolute: 0 x10E3/uL (ref 0.0–0.2)
Basos: 0 %
EOS (ABSOLUTE): 0.1 x10E3/uL (ref 0.0–0.4)
Eos: 1 %
HCV Ab: NONREACTIVE
HIV Screen 4th Generation wRfx: NONREACTIVE
Hematocrit: 41.2 % (ref 34.0–46.6)
Hemoglobin: 13.8 g/dL (ref 11.1–15.9)
Hepatitis B Surface Ag: NEGATIVE
Immature Grans (Abs): 0 x10E3/uL (ref 0.0–0.1)
Immature Granulocytes: 0 %
Lymphocytes Absolute: 1.7 x10E3/uL (ref 0.7–3.1)
Lymphs: 16 %
MCH: 30.6 pg (ref 26.6–33.0)
MCHC: 33.5 g/dL (ref 31.5–35.7)
MCV: 91 fL (ref 79–97)
Monocytes Absolute: 0.7 x10E3/uL (ref 0.1–0.9)
Monocytes: 7 %
Neutrophils Absolute: 7.7 x10E3/uL — ABNORMAL HIGH (ref 1.4–7.0)
Neutrophils: 76 %
Platelets: 212 x10E3/uL (ref 150–450)
RBC: 4.51 x10E6/uL (ref 3.77–5.28)
RDW: 12.6 % (ref 11.7–15.4)
RPR Ser Ql: NONREACTIVE
Rh Factor: POSITIVE
Rubella Antibodies, IGG: 2.12 {index} (ref >0.99)
WBC: 10.1 x10E3/uL (ref 3.4–10.8)

## 2023-12-22 LAB — COMPREHENSIVE METABOLIC PANEL WITH GFR
ALT: 18 IU/L (ref 0–32)
AST: 18 IU/L (ref 0–40)
Albumin: 4.4 g/dL (ref 3.9–4.9)
Alkaline Phosphatase: 76 IU/L (ref 44–121)
BUN/Creatinine Ratio: 14 (ref 9–23)
BUN: 12 mg/dL (ref 6–20)
Bilirubin Total: 0.5 mg/dL (ref 0.0–1.2)
CO2: 18 mmol/L — ABNORMAL LOW (ref 20–29)
Calcium: 9.4 mg/dL (ref 8.7–10.2)
Chloride: 101 mmol/L (ref 96–106)
Creatinine, Ser: 0.87 mg/dL (ref 0.57–1.00)
Globulin, Total: 2.9 g/dL (ref 1.5–4.5)
Glucose: 79 mg/dL (ref 70–99)
Potassium: 3.9 mmol/L (ref 3.5–5.2)
Sodium: 135 mmol/L (ref 134–144)
Total Protein: 7.3 g/dL (ref 6.0–8.5)
eGFR: 91 mL/min/1.73 (ref >59)

## 2023-12-22 LAB — TSH RFX ON ABNORMAL TO FREE T4: TSH: 0.633 u[IU]/mL (ref 0.450–4.500)

## 2023-12-22 LAB — PROTEIN / CREATININE RATIO, URINE
Creatinine, Urine: 23.3 mg/dL
Protein, Ur: <4 mg/dL

## 2023-12-22 LAB — HEMOGLOBIN A1C
Est. average glucose Bld gHb Est-mCnc: 103 mg/dL
Hgb A1c MFr Bld: 5.2 % (ref 4.8–5.6)

## 2023-12-22 LAB — HCV INTERPRETATION

## 2023-12-27 LAB — PANORAMA PRENATAL TEST FULL PANEL:PANORAMA TEST PLUS 5 ADDITIONAL MICRODELETIONS: FETAL FRACTION: 4.3

## 2023-12-29 LAB — HORIZON CUSTOM: REPORT SUMMARY: NEGATIVE

## 2024-01-18 ENCOUNTER — Telehealth: Payer: Self-pay

## 2024-01-18 ENCOUNTER — Ambulatory Visit (INDEPENDENT_AMBULATORY_CARE_PROVIDER_SITE_OTHER): Admitting: Obstetrics and Gynecology

## 2024-01-18 VITALS — BP 121/78 | HR 101 | Wt 229.0 lb

## 2024-01-18 DIAGNOSIS — Z3A14 14 weeks gestation of pregnancy: Secondary | ICD-10-CM

## 2024-01-18 DIAGNOSIS — N6311 Unspecified lump in the right breast, upper outer quadrant: Secondary | ICD-10-CM | POA: Diagnosis not present

## 2024-01-18 DIAGNOSIS — Z8759 Personal history of other complications of pregnancy, childbirth and the puerperium: Secondary | ICD-10-CM | POA: Diagnosis not present

## 2024-01-18 NOTE — Telephone Encounter (Signed)
 Attempted to reach pt to notify of MFM appt, no vm set up, phone kept disconnecting

## 2024-01-18 NOTE — Progress Notes (Signed)
   PRENATAL VISIT NOTE  Subjective:  Erin Whitney is a 33 y.o. G2P1001 at [redacted]w[redacted]d being seen today for ongoing prenatal care.  She is currently monitored for the following issues for this low-risk pregnancy and has Exercise-induced asthma; Seborrheic dermatitis of scalp; Class 1 obesity due to excess calories without serious comorbidity with body mass index (BMI) of 33.0 to 33.9 in adult; Sleep disorder; Mass of upper outer quadrant of right breast; Supervision of other normal pregnancy, antepartum; and History of gestational hypertension on their problem list.  Patient reports no complaints.  Contractions: Not present. Vag. Bleeding: None.   . Denies leaking of fluid.   The following portions of the patient's history were reviewed and updated as appropriate: allergies, current medications, past family history, past medical history, past social history, past surgical history and problem list.   Objective:    Vitals:   01/18/24 1136  BP: 121/78  Pulse: (!) 101  Weight: 229 lb (103.9 kg)    Fetal Status:  Fetal Heart Rate (bpm): 162        General: Alert, oriented and cooperative. Patient is in no acute distress.  Skin: Skin is warm and dry. No rash noted.   Cardiovascular: Normal heart rate noted  Respiratory: Normal respiratory effort, no problems with respiration noted  Abdomen: Soft, gravid, appropriate for gestational age.  Pain/Pressure: Present     Pelvic: Cervical exam deferred        Extremities: Normal range of motion.  Edema: None  Mental Status: Normal mood and affect. Normal behavior. Normal judgment and thought content.   Assessment and Plan:  Pregnancy: G2P1001 at [redacted]w[redacted]d 1. History of gestational hypertension (Primary) Continue low dose ASA  2. Pregnancy New OB labs negative For some reason, anatomy u/s scheeduled in two weeks. Front desk asked to change to be late sept/early oct  Preterm labor symptoms and general obstetric precautions including but not limited  to vaginal bleeding, contractions, leaking of fluid and fetal movement were reviewed in detail with the patient. Please refer to After Visit Summary for other counseling recommendations.   No follow-ups on file.  Future Appointments  Date Time Provider Department Center  01/31/2024  8:15 AM [facility] PROVIDER 1 [facility]  01/31/2024  8:30 AM [facility] US5 [facility]  02/14/2024  3:50 PM Erik Kieth BROCKS, MD [facility]  02/21/2024  8:30 AM Jackquline Sawyer, MD [facility] None    Bebe Furry, MD

## 2024-01-31 ENCOUNTER — Ambulatory Visit

## 2024-01-31 ENCOUNTER — Encounter: Payer: Self-pay | Admitting: Obstetrics & Gynecology

## 2024-01-31 ENCOUNTER — Other Ambulatory Visit

## 2024-02-02 ENCOUNTER — Other Ambulatory Visit: Payer: Self-pay | Admitting: *Deleted

## 2024-02-02 MED ORDER — CYCLOBENZAPRINE HCL 10 MG PO TABS: 10.0000 mg | ORAL_TABLET | Freq: Three times a day (TID) | ORAL | 2 refills | Status: DC | PRN

## 2024-02-14 ENCOUNTER — Encounter: Payer: Self-pay | Admitting: Obstetrics and Gynecology

## 2024-02-14 ENCOUNTER — Ambulatory Visit (INDEPENDENT_AMBULATORY_CARE_PROVIDER_SITE_OTHER): Admitting: Obstetrics and Gynecology

## 2024-02-14 VITALS — BP 132/85 | HR 120 | Wt 230.0 lb

## 2024-02-14 DIAGNOSIS — O99342 Other mental disorders complicating pregnancy, second trimester: Secondary | ICD-10-CM | POA: Diagnosis not present

## 2024-02-14 DIAGNOSIS — Z3A18 18 weeks gestation of pregnancy: Secondary | ICD-10-CM | POA: Diagnosis not present

## 2024-02-14 DIAGNOSIS — Z8759 Personal history of other complications of pregnancy, childbirth and the puerperium: Secondary | ICD-10-CM

## 2024-02-14 DIAGNOSIS — Z6833 Body mass index (BMI) 33.0-33.9, adult: Secondary | ICD-10-CM | POA: Diagnosis not present

## 2024-02-14 DIAGNOSIS — Z3480 Encounter for supervision of other normal pregnancy, unspecified trimester: Secondary | ICD-10-CM

## 2024-02-14 DIAGNOSIS — F419 Anxiety disorder, unspecified: Secondary | ICD-10-CM

## 2024-02-14 DIAGNOSIS — Z23 Encounter for immunization: Secondary | ICD-10-CM

## 2024-02-14 NOTE — Progress Notes (Signed)
 ROB   Flu Vaccine offered: will receive.  CC: Notes being anxious this pregnancy. Pt states she reached out to counseling

## 2024-02-14 NOTE — Progress Notes (Signed)
   PRENATAL VISIT NOTE  Subjective:  Erin Whitney is a 33 y.o. G2P1001 at [redacted]w[redacted]d being seen today for ongoing prenatal care.  She is currently monitored for the following issues for this low-risk pregnancy and has Exercise-induced asthma; Seborrheic dermatitis of scalp; Class 1 obesity due to excess calories without serious comorbidity with body mass index (BMI) of 33.0 to 33.9 in adult; Sleep disorder; Mass of upper outer quadrant of right breast; Supervision of other normal pregnancy, antepartum; and History of gestational hypertension on their problem list.  Patient reports anxiety around this pregnancy - feels like she doesn't deserve to have a second healthy pregnancy. Going 4 weeks between HR checks also made her anxiety worse. Has been dealing with a lot of family health issues and stressors, too. She had a good relationship with a counselor and is scheduled to see her again in 3 days.  Contractions: Not present. Vag. Bleeding: None.  Movement: Absent. Denies leaking of fluid.   The following portions of the patient's history were reviewed and updated as appropriate: allergies, current medications, past family history, past medical history, past social history, past surgical history and problem list.   Objective:   Vitals:   02/14/24 1520  BP: 132/85  Pulse: (!) 120  Weight: 230 lb (104.3 kg)   Fetal Status: Fetal Heart Rate (bpm): 152   Movement: Absent     General:  Alert, oriented and cooperative. Patient is in no acute distress.  Skin: Skin is warm and dry. No rash noted.   Cardiovascular: Normal heart rate noted  Respiratory: Normal respiratory effort, no problems with respiration noted  Abdomen: Soft, gravid, appropriate for gestational age.  Pain/Pressure: Absent      Assessment and Plan:  Pregnancy: G2P1001 at [redacted]w[redacted]d 1. Supervision of other normal pregnancy, antepartum (Primary) 2. [redacted] weeks gestation of pregnancy 3. Flu vaccine need AFP discussed, declines Flu shot  today  Anatomy scheduled 10/7 Will come back for RN visit in 2 weeks for HR check and repeat P/C ratio  - Flu vaccine trivalent PF, 6mos and older(Flulaval,Afluria,Fluarix,Fluzone)  4. Anxiety Discussed options for management including lifestyle, counseling, and medications. Reviewed that IBH is available, but she has appt with her counselor in 3 days. Plan to follow up mood after starting back with therapist, anatomy US , and starting more regular gentle exercise  5. History of gestational hypertension 6. BMI 33.0-33.9,adult Normotensive today ldASA Normal serum labs, needs P/C redrawn- she will do with RN visit in 2 weeks  Please refer to After Visit Summary for other counseling recommendations.   Return in about 2 weeks (around 02/28/2024) for doppler & P/C ratio (RN visit).  Future Appointments  Date Time Provider Department Center  02/21/2024  8:30 AM Jackquline Sawyer, MD [facility] None  02/22/2024  9:00 AM [facility] PROVIDER 1 [facility]  02/22/2024  9:30 AM [facility] US4 [facility]  02/29/2024 10:45 AM [facility] NURSE [facility]  03/13/2024  3:50 PM Eldonna Suzen Octave, MD [facility]  04/10/2024  3:30 PM Izell Harari, MD [facility]   Kieth JAYSON Carolin, MD

## 2024-02-15 ENCOUNTER — Encounter: Admitting: Obstetrics & Gynecology

## 2024-02-21 ENCOUNTER — Ambulatory Visit: Payer: BC Managed Care – PPO | Admitting: Dermatology

## 2024-02-21 DIAGNOSIS — D1801 Hemangioma of skin and subcutaneous tissue: Secondary | ICD-10-CM

## 2024-02-21 DIAGNOSIS — D229 Melanocytic nevi, unspecified: Secondary | ICD-10-CM

## 2024-02-21 DIAGNOSIS — L814 Other melanin hyperpigmentation: Secondary | ICD-10-CM

## 2024-02-21 DIAGNOSIS — L578 Other skin changes due to chronic exposure to nonionizing radiation: Secondary | ICD-10-CM | POA: Diagnosis not present

## 2024-02-21 DIAGNOSIS — Z1283 Encounter for screening for malignant neoplasm of skin: Secondary | ICD-10-CM

## 2024-02-21 DIAGNOSIS — Q825 Congenital non-neoplastic nevus: Secondary | ICD-10-CM

## 2024-02-21 DIAGNOSIS — L811 Chloasma: Secondary | ICD-10-CM

## 2024-02-21 DIAGNOSIS — L821 Other seborrheic keratosis: Secondary | ICD-10-CM

## 2024-02-21 DIAGNOSIS — L219 Seborrheic dermatitis, unspecified: Secondary | ICD-10-CM

## 2024-02-21 DIAGNOSIS — Z808 Family history of malignant neoplasm of other organs or systems: Secondary | ICD-10-CM

## 2024-02-21 DIAGNOSIS — W908XXA Exposure to other nonionizing radiation, initial encounter: Secondary | ICD-10-CM

## 2024-02-21 DIAGNOSIS — L858 Other specified epidermal thickening: Secondary | ICD-10-CM

## 2024-02-21 NOTE — Progress Notes (Signed)
 Follow-Up Visit   Subjective  Erin Whitney is a 33 y.o. female who presents for the following: Skin Cancer Screening and Full Body Skin Exam  The patient presents for Total-Body Skin Exam (TBSE) for skin cancer screening and mole check. The patient has spots, moles and lesions to be evaluated, some may be new or changing. History of seborrheic dermatitis, using Head & Shoulders shampoo which works. Didn't see improvement with luxiq  foam. No history of skin cancer. Family history of melanoma and NMSC. Patient is currently pregnant with a girl; due Feb 26.   The following portions of the chart were reviewed this encounter and updated as appropriate: medications, allergies, medical history  Review of Systems:  No other skin or systemic complaints except as noted in HPI or Assessment and Plan.  Objective  Well appearing patient in no apparent distress; mood and affect are within normal limits.  A full examination was performed including scalp, head, eyes, ears, nose, lips, neck, chest, axillae, abdomen, back, buttocks, bilateral upper extremities, bilateral lower extremities, hands, feet, fingers, toes, fingernails, and toenails. All findings within normal limits unless otherwise noted below.   Relevant physical exam findings are noted in the Assessment and Plan.    Assessment & Plan   SKIN CANCER SCREENING PERFORMED TODAY.  Family history of skin cancer - what type(s): MM, metastatic; NMSC - who affected: Paternal grandfather; Father  ACTINIC DAMAGE - Chronic condition, secondary to cumulative UV/sun exposure - diffuse scaly erythematous macules with underlying dyspigmentation - Recommend daily broad spectrum sunscreen SPF 30+ to sun-exposed areas, reapply every 2 hours as needed.  - Staying in the shade or wearing long sleeves, sun glasses (UVA+UVB protection) and wide brim hats (4-inch brim around the entire circumference of the hat) are also recommended for sun protection.  -  Call for new or changing lesions.  LENTIGINES, SEBORRHEIC KERATOSES, HEMANGIOMAS - Benign normal skin lesions - Benign-appearing - Call for any changes  MELANOCYTIC NEVI - Tan-brown and/or pink-flesh-colored symmetric macules and papules - Benign appearing on exam today - Observation - Call clinic for new or changing moles - Recommend daily use of broad spectrum spf 30+ sunscreen to sun-exposed areas.   CONGENITAL NON-NEOPLASTIC NEVUS Exam: 8mm fleshy brown papule suprapubic   Treatment Plan: Benign appearing on exam today. Recommend observation. Call clinic for new or changing moles. Recommend daily use of broad spectrum spf 30+ sunscreen to sun-exposed areas.   KERATOSIS PILARIS - Tiny follicular keratotic papules - Benign. Genetic in nature. No cure. - Observe. - Recommend regular moisturizers including Eucerin, Cetaphil, CeraVe. Avoid salicylic acid during pregnancy.  MELASMA - Patient currently pregnant.  Exam: reticulated hyperpigmented patches at forehead and right cheek > left   Chronic and persistent condition with duration or expected duration over one year.  Not currently at goal.    Melasma is a chronic; persistent condition of hyperpigmented patches generally on the face, worse in summer due to higher UV exposure.    Heredity; thyroid  disease; sun exposure; pregnancy; birth control pills; epilepsy medication and darker skin may predispose to Melasma.   Treatment Plan:  Recommend daily broad spectrum sunscreen with Zinc SPF 30+ to sun-exposed areas, reapply every 2 hours as needed. Call for new or changing lesions.  Staying in the shade or wearing long sleeves, sun glasses (UVA+UVB protection) and wide brim hats (4-inch brim around the entire circumference of the hat) are also recommended for sun protection.    SEBORRHEIC DERMATITIS Exam: Scalp clear.  Chronic  condition with duration or expected duration over one year. Currently well-controlled.  Seborrheic  Dermatitis is a chronic persistent rash characterized by pinkness and scaling most commonly of the mid face but also can occur on the scalp (dandruff), ears; mid chest, mid back and groin.  It tends to be exacerbated by stress and cooler weather.  People who have neurologic disease may experience new onset or exacerbation of existing seborrheic dermatitis.  The condition is not curable but treatable and can be controlled.  Treatment Plan: Continue Head & Shoulders shampoo.    Return in about 1 year (around 02/20/2025) for TBSE.  IAndrea Kerns, CMA, am acting as scribe for Rexene Rattler, MD .   Documentation: I have reviewed the above documentation for accuracy and completeness, and I agree with the above.  Rexene Rattler, MD

## 2024-02-21 NOTE — Patient Instructions (Addendum)
 Basic OTC daily skin care regimen to prevent photoaging:   Recommend facial moisturizer with sunscreen SPF 30 every morning (OTC brands include CeraVe AM, Neutrogena, Eucerin, Cetaphil, Aveeno, La Roche Posay).  Can also apply a topical Vit C serum which is an antioxidant (OTC brands include CeraVe, La Roche Posay, and The Ordinary) underneath sunscreen in morning. If you are outside during the day in the summer for extended periods, especially swimming and/or sweating, make sure you apply a water resistant facial sunscreen lotion spf 30 or higher.   At night recommend a cream with retinol (a vitamin A derivative which stimulates collagen production) like CeraVe skin renewing retinol serum or ROC retinol correxion cream or Neutrogena rapid wrinkle repair cream. Retinol may cause skin irritation in people with sensitive skin.  Can use it every other day and/or apply on top of a hyaluronic acid (HA) moisturizer/serum (Neutrogena Hydroboost water cream) if better tolerated that way.  Retinol may also help with lightening brown spots. Avoid during pregnancy.  Our office sells high quality, medically tested skin care lines such as Elta MD sunscreens (with Zinc), and Alastin skin care products, which are very effective in treating photoaging. The Alastin line includes cosmeceutical grade Vit.C serum, HA serum, Elastin stimulating moisturizers/serums, lightening serum, and sunscreens.  If you want prescription treatment, then you would need an appointment (Rx tretinoin and fade creams, Botox, filler injections, laser treatments, etc.) These prescriptions and procedures are not covered by insurance but work very well.    Due to recent changes in healthcare laws, you may see results of your pathology and/or laboratory studies on MyChart before the doctors have had a chance to review them. We understand that in some cases there may be results that are confusing or concerning to you. Please understand that not all  results are received at the same time and often the doctors may need to interpret multiple results in order to provide you with the best plan of care or course of treatment. Therefore, we ask that you please give us  2 business days to thoroughly review all your results before contacting the office for clarification. Should we see a critical lab result, you will be contacted sooner.   If You Need Anything After Your Visit  If you have any questions or concerns for your doctor, please call our main line at [phone] and press option 4 to reach your doctor's medical assistant. If no one answers, please leave a voicemail as directed and we will return your call as soon as possible. Messages left after 4 pm will be answered the following business day.   You may also send us  a message via MyChart. We typically respond to MyChart messages within 1-2 business days.  For prescription refills, please ask your pharmacy to contact our office. Our fax number is [phone].  If you have an urgent issue when the clinic is closed that cannot wait until the next business day, you can page your doctor at the number below.    Please note that while we do our best to be available for urgent issues outside of office hours, we are not available 24/7.   If you have an urgent issue and are unable to reach us , you may choose to seek medical care at your doctor's office, retail clinic, urgent care center, or emergency room.  If you have a medical emergency, please immediately call 911 or go to the emergency department.  Pager Numbers  - Dr. Hester: [phone]  - Dr.  Jackquline: [phone]  - Dr. Claudene: [phone]   - Dr. Raymund: [phone]  In the event of inclement weather, please call our main line at [phone] for an update on the status of any delays or closures.  Dermatology Medication Tips: Please keep the boxes that topical medications come in in order to help keep track of the  instructions about where and how to use these. Pharmacies typically print the medication instructions only on the boxes and not directly on the medication tubes.   If your medication is too expensive, please contact our office at [phone] option 4 or send us  a message through MyChart.   We are unable to tell what your co-pay for medications will be in advance as this is different depending on your insurance coverage. However, we may be able to find a substitute medication at lower cost or fill out paperwork to get insurance to cover a needed medication.   If a prior authorization is required to get your medication covered by your insurance company, please allow us  1-2 business days to complete this process.  Drug prices often vary depending on where the prescription is filled and some pharmacies may offer cheaper prices.  The website www.goodrx.com contains coupons for medications through different pharmacies. The prices here do not account for what the cost may be with help from insurance (it may be cheaper with your insurance), but the website can give you the price if you did not use any insurance.  - You can print the associated coupon and take it with your prescription to the pharmacy.  - You may also stop by our office during regular business hours and pick up a GoodRx coupon card.  - If you need your prescription sent electronically to a different pharmacy, notify our office through [facility] or by phone at [phone] option 4.     Si Usted Necesita Algo Despus de Su Visita  Tambin puede enviarnos un mensaje a travs de Clinical cytogeneticist. Por lo general respondemos a los mensajes de MyChart en el transcurso de 1 a 2 das hbiles.  Para renovar recetas, por favor pida a su farmacia que se ponga en contacto con nuestra oficina. Randi lakes de fax es [place] [phone].  Si tiene un asunto urgente cuando la clnica est cerrada y que no puede esperar hasta el siguiente da hbil,  puede llamar/localizar a su doctor(a) al nmero que aparece a continuacin.   Por favor, tenga en cuenta que aunque hacemos todo lo posible para estar disponibles para asuntos urgentes fuera del horario de [place], no estamos disponibles las 24 horas del da, los 7 [address]  [address] de la [place].   Si tiene un problema urgente y no puede comunicarse con nosotros, puede optar por buscar atencin mdica  en el consultorio de su doctor(a), en una clnica privada, en un centro de atencin urgente o en una sala de emergencias.  Si tiene Engineer, drilling, por favor llame inmediatamente al 911 o vaya a la sala de emergencias.  Nmeros de bper  - Dr. Hester: [phone]  - Dra. Jackquline: [phone]  - Dr. Claudene: [phone]  - Dra. Kitts: [phone]  En caso de inclemencias del [place], por favor llame a nuestra lnea principal al [phone] para una actualizacin sobre el estado de cualquier retraso o cierre.  Consejos para la medicacin en dermatologa: Por favor, guarde las cajas en las que vienen los medicamentos de uso tpico para ayudarle a seguir las instrucciones sobre dnde y cmo usarlos. Las farmacias generalmente  imprimen las instrucciones del medicamento slo en las cajas y no directamente en los tubos del [place].   Si su medicamento es muy caro, por favor, pngase en contacto con landry rieger llamando al [phone] y presione la opcin 4 o envenos un mensaje a travs de Clinical cytogeneticist.   No podemos decirle cul ser su copago por los medicamentos por adelantado ya que esto es diferente dependiendo de la cobertura de su seguro. Sin embargo, es posible que podamos encontrar un medicamento sustituto a Audiological scientist un formulario para que el seguro cubra el medicamento que se considera necesario.   Si se requiere una autorizacin previa para que su compaa de seguros malta su medicamento, por favor permtanos de 1 a 2 das hbiles para completar este proceso.  Los precios  de los medicamentos varan con frecuencia dependiendo del Environmental consultant de dnde se surte la receta y alguna farmacias pueden ofrecer precios ms baratos.  El sitio web www.goodrx.com tiene cupones para medicamentos de Health and safety inspector. Los precios aqu no tienen en cuenta lo que podra costar con la ayuda del seguro (puede ser ms barato con su seguro), pero el sitio web puede darle el precio si no utiliz Tourist information centre manager.  - Puede imprimir el cupn correspondiente y llevarlo con su receta a la farmacia.  - Tambin puede pasar por nuestra oficina durante el horario de atencin regular y Education officer, museum una tarjeta de cupones de GoodRx.  - Si necesita que su receta se enve electrnicamente a una farmacia diferente, informe a nuestra oficina a travs de MyChart de [facility] o por telfono llamando al [phone] y presione la opcin 4.

## 2024-02-22 ENCOUNTER — Encounter: Payer: Self-pay | Admitting: Obstetrics & Gynecology

## 2024-02-22 ENCOUNTER — Ambulatory Visit

## 2024-02-22 ENCOUNTER — Ambulatory Visit: Attending: Obstetrics | Admitting: Obstetrics

## 2024-02-22 ENCOUNTER — Other Ambulatory Visit: Payer: Self-pay | Admitting: *Deleted

## 2024-02-22 VITALS — BP 128/72

## 2024-02-22 DIAGNOSIS — Z3A19 19 weeks gestation of pregnancy: Secondary | ICD-10-CM | POA: Diagnosis not present

## 2024-02-22 DIAGNOSIS — O99212 Obesity complicating pregnancy, second trimester: Secondary | ICD-10-CM | POA: Insufficient documentation

## 2024-02-22 DIAGNOSIS — E669 Obesity, unspecified: Secondary | ICD-10-CM | POA: Diagnosis not present

## 2024-02-22 DIAGNOSIS — O09292 Supervision of pregnancy with other poor reproductive or obstetric history, second trimester: Secondary | ICD-10-CM

## 2024-02-22 DIAGNOSIS — Z8759 Personal history of other complications of pregnancy, childbirth and the puerperium: Secondary | ICD-10-CM | POA: Diagnosis not present

## 2024-02-22 DIAGNOSIS — Z3A10 10 weeks gestation of pregnancy: Secondary | ICD-10-CM

## 2024-02-22 DIAGNOSIS — O43192 Other malformation of placenta, second trimester: Secondary | ICD-10-CM | POA: Diagnosis not present

## 2024-02-22 DIAGNOSIS — Z363 Encounter for antenatal screening for malformations: Secondary | ICD-10-CM | POA: Diagnosis not present

## 2024-02-22 DIAGNOSIS — O358XX0 Maternal care for other (suspected) fetal abnormality and damage, not applicable or unspecified: Secondary | ICD-10-CM

## 2024-02-22 DIAGNOSIS — O43199 Other malformation of placenta, unspecified trimester: Secondary | ICD-10-CM | POA: Insufficient documentation

## 2024-02-22 DIAGNOSIS — Z3480 Encounter for supervision of other normal pregnancy, unspecified trimester: Secondary | ICD-10-CM

## 2024-02-22 NOTE — Progress Notes (Signed)
 MFM Consult Note  Erin Whitney is currently at 19 weeks and 5 days.  She was seen for a detailed fetal anatomy scan.  Her prior pregnancy was complicated by gestational hypertension.  The patient reports that this pregnancy was conceived with an IUD in place.  The IUD was removed at around 7 weeks of her current pregnancy.  She denies any significant past medical history and denies any problems in her current pregnancy.  Her blood pressure today was 128/72.  She had a cell free DNA test earlier in her pregnancy which indicated a low risk for trisomy 46, 3, and 13. A female fetus is predicted.   Sonographic findings Single intrauterine pregnancy at 19w 5d  Fetal cardiac activity:  Observed and appears normal. Presentation: Variable. The anatomic structures that were well seen appear normal without evidence of soft markers. Due to poor acoustic windows some structures remain suboptimally visualized. Fetal biometry shows the estimated fetal weight at the 25th percentile.  Amniotic fluid: Within normal limits.  MVP: 6.73 cm. Placenta: Anterior. Adnexa: No abnormality visualized. Cervical length: 3.7 cm.  The patient was informed that anomalies may be missed due to technical limitations. If the fetus is in a suboptimal position or maternal habitus is increased, visualization of the fetus in the maternal uterus may be impaired.  A marginal cord insertion was noted on today's exam.  The small risk of IUGR associated with a marginal placental cord insertion was discussed.    We will continue to follow her with growth ultrasounds throughout her pregnancy due to this indication.  History of gestational hypertension in her prior pregnancy The increased risk of preeclampsia should she develop gestational hypertension again in her current pregnancy was discussed.   She should continue taking a daily baby aspirin  for preeclampsia prophylaxis.  A follow-up exam was scheduled in 5 weeks to reassess  the views of the fetal anatomy and fetal growth.    The patient stated that all of her questions were answered today.  A total of 30 minutes was spent counseling and coordinating the care for this patient.  Greater than 50% of the time was spent in direct face-to-face contact.

## 2024-02-29 ENCOUNTER — Ambulatory Visit

## 2024-02-29 VITALS — BP 124/78 | HR 86

## 2024-02-29 DIAGNOSIS — Z8759 Personal history of other complications of pregnancy, childbirth and the puerperium: Secondary | ICD-10-CM

## 2024-02-29 NOTE — Progress Notes (Signed)
 Subjective:  Erin Whitney is a 33 y.o. female here for BP check, Labs and FHT's.   Hypertension ROS: Patient denies any headaches, visual symptoms, RUQ/epigastric pain or other concerning symptoms.  Objective:  LMP 10/07/2023 (Exact Date)   Appearance alert, well appearing, and in no distress. General exam BP noted to be WNL today in office.  FHT's 158.   Assessment:   Blood Pressure well controlled.   Plan:  Keep scheduled OB appt.SABRA

## 2024-03-01 LAB — PROTEIN / CREATININE RATIO, URINE
Creatinine, Urine: 85.3 mg/dL
Protein, Ur: 5.8 mg/dL
Protein/Creat Ratio: 68 mg/g{creat} (ref 0–200)

## 2024-03-02 ENCOUNTER — Ambulatory Visit: Payer: Self-pay | Admitting: Family Medicine

## 2024-03-13 ENCOUNTER — Ambulatory Visit (INDEPENDENT_AMBULATORY_CARE_PROVIDER_SITE_OTHER): Admitting: Family Medicine

## 2024-03-13 VITALS — BP 135/84 | HR 103 | Wt 234.0 lb

## 2024-03-13 DIAGNOSIS — Z3A22 22 weeks gestation of pregnancy: Secondary | ICD-10-CM

## 2024-03-13 DIAGNOSIS — O43199 Other malformation of placenta, unspecified trimester: Secondary | ICD-10-CM | POA: Diagnosis not present

## 2024-03-13 DIAGNOSIS — Z3480 Encounter for supervision of other normal pregnancy, unspecified trimester: Secondary | ICD-10-CM

## 2024-03-13 DIAGNOSIS — Z8759 Personal history of other complications of pregnancy, childbirth and the puerperium: Secondary | ICD-10-CM | POA: Diagnosis not present

## 2024-03-13 NOTE — Progress Notes (Signed)
   PRENATAL VISIT NOTE  Subjective:  Erin Whitney is a 33 y.o. G2P1001 at [redacted]w[redacted]d being seen today for ongoing prenatal care.  She is currently monitored for the following issues for this low-risk pregnancy and has Exercise-induced asthma; Seborrheic dermatitis of scalp; Class 1 obesity due to excess calories without serious comorbidity with body mass index (BMI) of 33.0 to 33.9 in adult; Sleep disorder; Mass of upper outer quadrant of right breast; Supervision of other normal pregnancy, antepartum; History of gestational hypertension; and Marginal insertion of umbilical cord affecting management of mother on their problem list.  Patient reports no complaints.  Contractions: Not present. Vag. Bleeding: None.  Movement: Present. Denies leaking of fluid.   The following portions of the patient's history were reviewed and updated as appropriate: allergies, current medications, past family history, past medical history, past social history, past surgical history and problem list.   Objective:    Vitals:   03/13/24 1552  BP: 135/84  Pulse: (!) 103  Weight: 234 lb (106.1 kg)    Fetal Status:  Fetal Heart Rate (bpm): 154   Movement: Present    General: Alert, oriented and cooperative. Patient is in no acute distress.  Skin: Skin is warm and dry. No rash noted.   Cardiovascular: Normal heart rate noted  Respiratory: Normal respiratory effort, no problems with respiration noted  Abdomen: Soft, gravid, appropriate for gestational age.  Pain/Pressure: Present     Pelvic: Cervical exam deferred        Extremities: Normal range of motion.  Edema: None  Mental Status: Normal mood and affect. Normal behavior. Normal judgment and thought content.   Assessment and Plan:  Pregnancy: G2P1001 at [redacted]w[redacted]d 1. Supervision of other normal pregnancy, antepartum (Primary) Up to date Feeling movement FH appropriated Counseled on FM expectations Patient with hip/back/pelvic pain-- discussed ligament  stretching  2. History of gestational hypertension BP WNL-- high normal Baseline labs WNL  3. Marginal insertion of umbilical cord affecting management of mother EFW upcoming  4. [redacted] weeks gestation of pregnancy   Preterm labor symptoms and general obstetric precautions including but not limited to vaginal bleeding, contractions, leaking of fluid and fetal movement were reviewed in detail with the patient. Please refer to After Visit Summary for other counseling recommendations.   Return in about 4 weeks (around 04/10/2024) for Routine prenatal care.  Future Appointments  Date Time Provider Department Center  03/28/2024  9:15 AM [facility] PROVIDER 1 [facility]  03/28/2024  9:30 AM [facility] US3 [facility]  04/10/2024  3:30 PM Izell Harari, MD [facility]  02/27/2025  8:30 AM Jackquline Sawyer, MD [facility] None    Suzen Maryan Masters, MD

## 2024-03-13 NOTE — Progress Notes (Signed)
 ROB   CC: Pain w/walking in back, hips and lower belly.

## 2024-03-14 ENCOUNTER — Encounter: Payer: Self-pay | Admitting: *Deleted

## 2024-03-28 ENCOUNTER — Other Ambulatory Visit: Payer: Self-pay | Admitting: *Deleted

## 2024-03-28 ENCOUNTER — Ambulatory Visit: Attending: Obstetrics | Admitting: Obstetrics

## 2024-03-28 ENCOUNTER — Ambulatory Visit (HOSPITAL_BASED_OUTPATIENT_CLINIC_OR_DEPARTMENT_OTHER)

## 2024-03-28 VITALS — BP 146/70

## 2024-03-28 DIAGNOSIS — O43199 Other malformation of placenta, unspecified trimester: Secondary | ICD-10-CM

## 2024-03-28 DIAGNOSIS — Z8759 Personal history of other complications of pregnancy, childbirth and the puerperium: Secondary | ICD-10-CM

## 2024-03-28 DIAGNOSIS — O99212 Obesity complicating pregnancy, second trimester: Secondary | ICD-10-CM

## 2024-03-28 DIAGNOSIS — O09292 Supervision of pregnancy with other poor reproductive or obstetric history, second trimester: Secondary | ICD-10-CM | POA: Diagnosis not present

## 2024-03-28 DIAGNOSIS — E669 Obesity, unspecified: Secondary | ICD-10-CM

## 2024-03-28 DIAGNOSIS — Z3A24 24 weeks gestation of pregnancy: Secondary | ICD-10-CM

## 2024-03-28 DIAGNOSIS — O358XX0 Maternal care for other (suspected) fetal abnormality and damage, not applicable or unspecified: Secondary | ICD-10-CM

## 2024-03-28 DIAGNOSIS — Z3480 Encounter for supervision of other normal pregnancy, unspecified trimester: Secondary | ICD-10-CM

## 2024-03-28 NOTE — Progress Notes (Signed)
 MFM Consult Note  Erin Whitney is currently at 24 weeks and 5 days.  She has been followed due to history of gestational hypertension in her prior pregnancy.  The views of the fetal anatomy were unable to be fully visualized during her last exam.  She denies any problems since her last exam.  Her blood pressure today was 146/70.  Sonographic findings Single intrauterine pregnancy at 24w 5d.  Fetal cardiac activity:  Observed and appears normal. Presentation: Transverse, head to maternal right. The views of the fetal anatomy were visualized today.  There were no obvious fetal anomalies noted. Fetal biometry shows the estimated fetal weight of 1 pound 7 ounces which measures at the 18th percentile. Amniotic fluid volume: Within normal limits. MVP: 3.67 cm. Placenta: Anterior.  The patient was informed that anomalies may be missed due to technical limitations. If the fetus is in a suboptimal position or maternal habitus is increased, visualization of the fetus in the maternal uterus may be impaired.  History of gestational hypertension in her prior pregnancy The increased risk of preeclampsia should she develop gestational hypertension again in her current pregnancy was discussed.   She should continue taking a daily baby aspirin  for preeclampsia prophylaxis.  Due to her history of gestational hypertension, follow-up growth scan was scheduled in the third trimester (in 8 weeks).  The patient stated that all of her questions were answered today.  A total of 20 minutes was spent counseling and coordinating the care for this patient.  Greater than 50% of the time was spent in direct face-to-face contact.

## 2024-04-10 ENCOUNTER — Ambulatory Visit (INDEPENDENT_AMBULATORY_CARE_PROVIDER_SITE_OTHER): Admitting: Obstetrics and Gynecology

## 2024-04-10 VITALS — BP 139/81 | HR 86 | Wt 239.0 lb

## 2024-04-10 DIAGNOSIS — O99210 Obesity complicating pregnancy, unspecified trimester: Secondary | ICD-10-CM | POA: Diagnosis not present

## 2024-04-10 DIAGNOSIS — Z8759 Personal history of other complications of pregnancy, childbirth and the puerperium: Secondary | ICD-10-CM | POA: Diagnosis not present

## 2024-04-10 DIAGNOSIS — O43199 Other malformation of placenta, unspecified trimester: Secondary | ICD-10-CM | POA: Diagnosis not present

## 2024-04-10 DIAGNOSIS — Z3A26 26 weeks gestation of pregnancy: Secondary | ICD-10-CM

## 2024-04-10 DIAGNOSIS — Z6834 Body mass index (BMI) 34.0-34.9, adult: Secondary | ICD-10-CM | POA: Diagnosis not present

## 2024-04-10 NOTE — Progress Notes (Unsigned)
 ROB   CC: None

## 2024-04-11 DIAGNOSIS — Z6834 Body mass index (BMI) 34.0-34.9, adult: Secondary | ICD-10-CM | POA: Insufficient documentation

## 2024-04-11 NOTE — Progress Notes (Signed)
 PRENATAL VISIT NOTE  Subjective:  Erin Whitney is a 33 y.o. G2P1001 at [redacted]w[redacted]d being seen today for ongoing prenatal care.  She is currently monitored for the following issues for this low-risk pregnancy and has Exercise-induced asthma; Seborrheic dermatitis of scalp; Class 1 obesity due to excess calories without serious comorbidity with body mass index (BMI) of 33.0 to 33.9 in adult; Sleep disorder; Mass of upper outer quadrant of right breast; Supervision of other normal pregnancy, antepartum; History of gestational hypertension; Marginal insertion of umbilical cord affecting management of mother; and BMI 34.0-34.9,adult on their problem list.  Patient reports no complaints.  Contractions: Not present. Vag. Bleeding: None.  Movement: Present. Denies leaking of fluid.   The following portions of the patient's history were reviewed and updated as appropriate: allergies, current medications, past family history, past medical history, past social history, past surgical history and problem list.   Objective:   Vitals:   04/10/24 1544  BP: 139/81  Pulse: 86  Weight: 239 lb (108.4 kg)    Fetal Status:  Fetal Heart Rate (bpm): 158   Movement: Present    General: Alert, oriented and cooperative. Patient is in no acute distress.  Skin: Skin is warm and dry. No rash noted.   Cardiovascular: Normal heart rate noted  Respiratory: Normal respiratory effort, no problems with respiration noted  Abdomen: Soft, gravid, appropriate for gestational age.  Pain/Pressure: Present     Pelvic: Cervical exam deferred        Extremities: Normal range of motion.  Edema: Trace  Mental Status: Normal mood and affect. Normal behavior. Normal judgment and thought content.      12/21/2023    9:32 AM 10/21/2023    8:02 AM 06/15/2023   12:40 PM  Depression screen PHQ 2/9  Decreased Interest 0 0 0  Down, Depressed, Hopeless 0 0 0  PHQ - 2 Score 0 0 0  Altered sleeping 1 0 1  Tired, decreased energy 1 1 1    Change in appetite 0 0 0  Feeling bad or failure about yourself  0 0 0  Trouble concentrating 0 0 0  Moving slowly or fidgety/restless 0 0 0  Suicidal thoughts 0 0 0  PHQ-9 Score 2  1  2    Difficult doing work/chores Not difficult at all Not difficult at all Not difficult at all     Data saved with a previous flowsheet row definition        12/21/2023    9:33 AM 10/21/2023    8:02 AM 06/15/2023   12:40 PM 09/02/2021    3:17 PM  GAD 7 : Generalized Anxiety Score  Nervous, Anxious, on Edge 1 1 1 1   Control/stop worrying 0 0 2 1  Worry too much - different things 0 0 2 1  Trouble relaxing 1 0 1 1  Restless 0 0 0 0  Easily annoyed or irritable 0 1 1 0  Afraid - awful might happen 0 0 2 1  Total GAD 7 Score 2 2 9 5   Anxiety Difficulty Not difficult at all Not difficult at all Somewhat difficult Somewhat difficult    Assessment and Plan:  Pregnancy: G2P1001 at [redacted]w[redacted]d 1. [redacted] weeks gestation of pregnancy (Primary) 28wk labs next visit  2. Marginal insertion of umbilical cord affecting management of mother Continue with serial growth scans 11/11: efw 18%, 665g, ac 35%, afi wnl  3. History of gestational hypertension Continue low dose ASA  4. BMI 34.0-34.9,adult 12lbs total weight  gain  Preterm labor symptoms and general obstetric precautions including but not limited to vaginal bleeding, contractions, leaking of fluid and fetal movement were reviewed in detail with the patient. Please refer to After Visit Summary for other counseling recommendations.   Return in about 10 days (around 04/20/2024) for in person, fasting 2hr GTT, md or app.  Future Appointments  Date Time Provider Department Center  04/25/2024  8:00 AM [facility]  04/25/2024  8:35 AM Fredirick Glenys RAMAN, MD [facility]  05/09/2024  8:35 AM Fredirick Glenys RAMAN, MD [facility]  05/23/2024  7:15 AM [facility] PROVIDER 1 [facility]  05/23/2024  7:30 AM [facility] US1 [facility]  05/23/2024   3:50 PM Anyanwu, Gloris LABOR, MD [facility]  06/06/2024  8:35 AM Izell Harari, MD [facility]  02/27/2025  8:30 AM Jackquline Sawyer, MD [facility] None    Harari Izell, MD

## 2024-04-20 ENCOUNTER — Other Ambulatory Visit

## 2024-04-25 ENCOUNTER — Other Ambulatory Visit

## 2024-04-25 ENCOUNTER — Encounter: Admitting: Family Medicine

## 2024-04-28 ENCOUNTER — Ambulatory Visit: Admitting: Obstetrics & Gynecology

## 2024-04-28 ENCOUNTER — Other Ambulatory Visit

## 2024-04-28 VITALS — BP 112/75 | HR 92 | Wt 238.5 lb

## 2024-04-28 DIAGNOSIS — Z23 Encounter for immunization: Secondary | ICD-10-CM

## 2024-04-28 DIAGNOSIS — O43199 Other malformation of placenta, unspecified trimester: Secondary | ICD-10-CM

## 2024-04-28 DIAGNOSIS — Z8759 Personal history of other complications of pregnancy, childbirth and the puerperium: Secondary | ICD-10-CM

## 2024-04-28 DIAGNOSIS — Z3A28 28 weeks gestation of pregnancy: Secondary | ICD-10-CM

## 2024-04-28 DIAGNOSIS — Z3480 Encounter for supervision of other normal pregnancy, unspecified trimester: Secondary | ICD-10-CM

## 2024-04-28 NOTE — Patient Instructions (Addendum)
TDaP Vaccine Pregnancy Get the Whooping Cough Vaccine While You Are Pregnant (CDC)  It is important for women to get the whooping cough vaccine in the third trimester of each pregnancy. Vaccines are the best way to prevent this disease. There are 2 different whooping cough vaccines. Both vaccines combine protection against whooping cough, tetanus and diphtheria, but they are for different age groups: Tdap: for everyone 11 years or older, including pregnant women  DTaP: for children 2 months through 52 years of age  You need the whooping cough vaccine during each of your pregnancies The recommended time to get the shot is during your 27th through 36th week of pregnancy, preferably during the earlier part of this time period. The Centers for Disease Control and Prevention (CDC) recommends that pregnant women receive the whooping cough vaccine for adolescents and adults (called Tdap vaccine) during the third trimester of each pregnancy. The recommended time to get the shot is during your 27th through 36th week of pregnancy, preferably during the earlier part of this time period. This replaces the original recommendation that pregnant women get the vaccine only if they had not previously received it. The [employer] of [employer]nec[employer] and the [employer] support this recommendation.  You should get the whooping cough vaccine while pregnant to pass protection to your baby frame support disabled and/or not supported in this browser  Learn why Vernona Rieger decided to get the whooping cough vaccine in her 3rd trimester of pregnancy and how her baby girl was born with some protection against the disease. Also available on YouTube. After receiving the whooping cough vaccine, your body will create protective antibodies (proteins produced by the body to fight off diseases) and pass some of them to your baby before birth. These antibodies provide your baby some short-term  protection against whooping cough in early life. These antibodies can also protect your baby from some of the more serious complications that come along with whooping cough. Your protective antibodies are at their highest about 2 weeks after getting the vaccine, but it takes time to pass them to your baby. So the preferred time to get the whooping cough vaccine is early in your third trimester. The amount of whooping cough antibodies in your body decreases over time. That is why CDC recommends you get a whooping cough vaccine during each pregnancy. Doing so allows each of your babies to get the greatest number of protective antibodies from you. This means each of your babies will get the best protection possible against this disease.  Getting the whooping cough vaccine while pregnant is better than getting the vaccine after you give birth Whooping cough vaccination during pregnancy is ideal so your baby will have short-term protection as soon as he is born. This early protection is important because your baby will not start getting his whooping cough vaccines until he is 2 months old. These first few months of life are when your baby is at greatest risk for catching whooping cough. This is also when he's at greatest risk for having severe, potentially life-threating complications from the infection. To avoid that gap in protection, it is best to get a whooping cough vaccine during pregnancy. You will then pass protection to your baby before he is born. To continue protecting your baby, he should get whooping cough vaccines starting at 2 months old. You may never have gotten the Tdap vaccine before and did not get it during this pregnancy. If so, you should  make sure to get the vaccine immediately after you give birth, before leaving the hospital or birthing center. It will take about 2 weeks before your body develops protection (antibodies) in response to the vaccine. Once you have protection from the vaccine,  you are less likely to give whooping cough to your newborn while caring for him. But remember, your baby will still be at risk for catching whooping cough from others. A recent study looked to see how effective Tdap was at preventing whooping cough in babies whose mothers got the vaccine while pregnant or in the hospital after giving birth. The study found that getting Tdap between 27 through 36 weeks of pregnancy is 85% more effective at preventing whooping cough in babies younger than 2 months old. Blood tests cannot tell if you need a whooping cough vaccine There are no blood tests that can tell you if you have enough antibodies in your body to protect yourself or your baby against whooping cough. Even if you have been sick with whooping cough in the past or previously received the vaccine, you still should get the vaccine during each pregnancy. Breastfeeding may pass some protective antibodies onto your baby By breastfeeding, you may pass some antibodies you have made in response to the vaccine to your baby. When you get a whooping cough vaccine during your pregnancy, you will have antibodies in your breast milk that you can share with your baby as soon as your milk comes in. However, your baby will not get protective antibodies immediately if you wait to get the whooping cough vaccine until after delivering your baby. This is because it takes about 2 weeks for your body to create antibodies. Learn more about the health benefits of breastfeeding.   RSV Vaccination for Pregnant People  CDC recommends two ways to protect babies from getting very sick with Respiratory Syncytial Virus (RSV):  An RSV vaccination given during pregnancy  Pfizer's vaccine Verdis Frederickson) is recommended for use during pregnancy. It is given during RSV season to people who are 32 through [redacted] weeks pregnant.  Or, An RSV immunization given directly to infants and some older babies  Babies born to mothers who get RSV vaccine at  least 2 weeks before delivery will have protection and, in most cases, should not need an RSV immunization later.    When is RSV season?  In most regions of the [place] RSV season starts in the fall and peaks in the winter, but the timing and severity of RSV season can vary from place to place and year to year.   The goal of maternal RSV vaccination is to protect babies from getting very sick with RSV during their first RSV season.  In most of the [place], this means maternal RSV vaccine will be given in September through January.  Who should get the maternal RSV vaccine?  People who are 53 through [redacted] weeks pregnant during September through January should get one dose of maternal RSV vaccine to protect their babies. RSV season can vary around the country.   How is the maternal RSV vaccine administered?  Maternal RSV vaccine is given as a shot into the mother's upper arm. Only a single dose (one shot) of maternal RSV vaccine is recommended.   It is not yet known whether another dose might be needed in later pregnancies.  How well does the maternal RSV vaccine work?  When someone gets RSV vaccine, their body responds by making a protein that protects against  the virus that causes RSV. The process takes about 2 weeks. When a pregnant person gets RSV vaccine, their protective proteins (called antibodies) also pass to their baby. So, babies who are born at least 2 weeks after their mother gets RSV vaccine are protected at birth, when infants are at the highest risk of severe RSV disease.   The vaccine can reduce a baby's risk of being hospitalized from RSV by 57% in the first six months after birth.  What are the possible side effects of the maternal RSV vaccine?  In the clinical trials, the side effects most often reported by pregnant people who received the maternal RSV vaccine were pain at the injection site, headache, muscle pain, and nausea.  Although not  common, a dangerous high blood pressure condition called pre-eclampsia occurred in 1.8% of pregnant people who received the maternal RSV vaccine compared to 1.4% of pregnant people who received a placebo.  The clinical trials identified a small increase in the number of preterm births in vaccinated pregnant people. It is not clear if this is a true safety problem related to RSV vaccine or if this occurred for reasons unrelated to vaccination.  To reduce the potential risk of preterm birth and complications from RSV disease, FDA approved the maternal RSV vaccine for use during weeks 32 through 13 of pregnancy while additional studies are conducted.  FDA is requiring the manufacturer to do additional studies that will look more closely at the potential risk of preterm births and pregnancy-related high blood pressure issues in mothers, including pre-eclampsia.  Severe allergic reactions to vaccines are rare but can happen after any vaccine and can be life-threatening. If you see signs of a severe allergic reaction after vaccination (hives, swelling of the face and throat, difficulty breathing, a fast heartbeat, dizziness, or weakness), seek immediate medical care by calling 911.  As with any medicine or vaccine there is a very remote chance of the vaccine causing other serious injury or death after vaccination.  Adverse events following vaccination should be reported to the Vaccine Adverse Event Reporting System (VAERS), even if it's not clear that the vaccine caused the adverse event. You or your doctor can report an adverse event to [facility] and FDA through VAERS. If you need further assistance reporting to VAERS, please email [email or web address] or call [phone].  If you have any questions about side effects from the maternal RSV vaccine, talk with your healthcare provider.  Do I need a prescription for a maternal RSV vaccine?  Until the vaccine available in the office, you will need a prescription to  take to a local pharmacy that is providing the vaccine.   How do I pay for the maternal RSV vaccine?  Most private health insurance plans cover the maternal RSV vaccine, but there may be a cost to you depending on your plan.  Contact your insurer to find out.  Medicaid Beginning February 15, 2022, most people with coverage from [facility] and [employer] [facility]) will be guaranteed coverage of all vaccines recommended by the Advisory Committee on Immunization Practice at no cost to them.   Source: [facility] for Immunization and Respiratory Diseases

## 2024-04-28 NOTE — Progress Notes (Signed)
 PRENATAL VISIT NOTE  Subjective:  ROSHAWNDA Whitney is a 33 y.o. G2P1001 at [redacted]w[redacted]d being seen today for ongoing prenatal care.  She is currently monitored for the following issues for this high-risk pregnancy and has Exercise-induced asthma; Seborrheic dermatitis of scalp; Class 1 obesity due to excess calories without serious comorbidity with body mass index (BMI) of 33.0 to 33.9 in adult; Sleep disorder; Mass of upper outer quadrant of right breast; Supervision of other normal pregnancy, antepartum; History of gestational hypertension; Marginal insertion of umbilical cord affecting management of mother; and BMI 34.0-34.9,adult on their problem list.  Patient reports no complaints.  Contractions: Not present. Vag. Bleeding: None.  Movement: Present. Denies leaking of fluid.   The following portions of the patient's history were reviewed and updated as appropriate: allergies, current medications, past family history, past medical history, past social history, past surgical history and problem list.   Objective:   Vitals:   04/28/24 0918  BP: 112/75  Pulse: 92  Weight: 238 lb 8 oz (108.2 kg)    Fetal Status:  Fetal Heart Rate (bpm): 142   Movement: Present    General: Alert, oriented and cooperative. Patient is in no acute distress.  Skin: Skin is warm and dry. No rash noted.   Cardiovascular: Normal heart rate noted  Respiratory: Normal respiratory effort, no problems with respiration noted  Abdomen: Soft, gravid, appropriate for gestational age.  Pain/Pressure: Present     Pelvic: Cervical exam deferred        Extremities: Normal range of motion.  Edema: None  Mental Status: Normal mood and affect. Normal behavior. Normal judgment and thought content.      12/21/2023    9:32 AM 10/21/2023    8:02 AM 06/15/2023   12:40 PM  Depression screen PHQ 2/9  Decreased Interest 0 0 0  Down, Depressed, Hopeless 0 0 0  PHQ - 2 Score 0 0 0  Altered sleeping 1 0 1  Tired, decreased energy 1 1 1    Change in appetite 0 0 0  Feeling bad or failure about yourself  0 0 0  Trouble concentrating 0 0 0  Moving slowly or fidgety/restless 0 0 0  Suicidal thoughts 0 0 0  PHQ-9 Score 2  1  2    Difficult doing work/chores Not difficult at all Not difficult at all Not difficult at all     Data saved with a previous flowsheet row definition        12/21/2023    9:33 AM 10/21/2023    8:02 AM 06/15/2023   12:40 PM 09/02/2021    3:17 PM  GAD 7 : Generalized Anxiety Score  Nervous, Anxious, on Edge 1 1 1 1   Control/stop worrying 0 0 2 1  Worry too much - different things 0 0 2 1  Trouble relaxing 1 0 1 1  Restless 0 0 0 0  Easily annoyed or irritable 0 1 1 0  Afraid - awful might happen 0 0 2 1  Total GAD 7 Score 2 2 9 5   Anxiety Difficulty Not difficult at all Not difficult at all Somewhat difficult Somewhat difficult   US  MFM OB FOLLOW UP Result Date: 03/28/2024 ----------------------------------------------------------------------  OBSTETRICS REPORT                       (Signed Final 03/28/2024 10:20 am) ---------------------------------------------------------------------- Patient Info  ID #:       [id number]  D.O.B.:  [birth year] (33 yrs)(F)  Name:       Erin Whitney                 Visit Date: 03/28/2024 09:22 am ---------------------------------------------------------------------- Performed By  Attending:        Steffan Keys MD         Ref. Address:     [address]                                                             [address]  Performed By:     Erminio Gentry            [facility]  Referred By:      [facility] ---------------------------------------------------------------------- Orders  #  Description                            Code        Ordered By  1  US  MFM OB FOLLOW UP                   [id number]    Erin Whitney ----------------------------------------------------------------------  #  Order #                     Accession #                Episode #  1  [id number]                   [id number]                 [id number] ---------------------------------------------------------------------- Indications  Obesity complicating pregnancy, second         O99.212  trimester (PG BMI 32)  Poor obstetric history: Previous gestational   O09.299  HTN  [redacted] weeks gestation of pregnancy                Z3A.24  LR female, neg Horizon ---------------------------------------------------------------------- Fetal Evaluation  Num Of Fetuses:         1  Fetal Heart Rate(bpm):  145  Cardiac Activity:  Observed  Presentation:           Transverse, head to maternal right  Placenta:               Anterior  P. Cord Insertion:      Visualized  Amniotic Fluid  AFI FV:      Within normal limits                              Largest Pocket(cm)                              3.67 ---------------------------------------------------------------------- Biometry  BPD:      62.5  mm     G. Age:  25w 2d         65  %    CI:        76.86   %    70 - 86                                                          FL/HC:      18.2   %    18.7 - 20.3  HC:      225.8  mm     G. Age:  24w 4d         26  %    HC/AC:      1.14        1.04 - 1.22  AC:      198.9  mm     G. Age:  24w 4d         35  %    FL/BPD:     65.8   %    71 - 87  FL:       41.1  mm     G. Age:  23w 2d          6  %    FL/AC:      20.7   %    20 - 24  HUM:      40.5  mm     G. Age:  24w 4d         40  %  LV:        5.2  mm  Est. FW:     665  gm      1 lb 7 oz     18  % ---------------------------------------------------------------------- OB History  Gravidity:    2         Term:   1        Prem:   0        SAB:   0  TOP:          0       Ectopic:  0        Living: 1  ---------------------------------------------------------------------- Gestational Age  LMP:           24w 5d        Date:  10/07/23                  EDD:   07/13/24  U/S Today:     24w 3d  EDD:   07/15/24  Best:          24w 5d     Det. By:  LMP  (10/07/23)          EDD:   07/13/24 ---------------------------------------------------------------------- Targeted Anatomy  Central Nervous System  Calvarium/Cranial V.:  Appears normal         Cereb./Vermis:          Previously seen  Cavum:                 Appears normal         Cisterna Magna:         Previously seen  Lateral Ventricles:    Appears normal         Midline Falx:           Previously seen  Choroid Plexus:        Previously seen  Spine  Cervical:              Previously seen        Sacral:                 Previously seen  Thoracic:              Previously seen        Shape/Curvature:        Previously seen  Lumbar:                Previously seen  Head/Neck  Lips:                  Not well visualized    Profile:                Appears normal  Neck:                  Previously seen        Orbits/Eyes:            Previously seen  Nuchal Fold:           Previously seen        Mandible:               Previously seen  Nasal Bone:            Present                Maxilla:                Previously seen  Thorax  4 Chamber View:        Appears normal         Interventr. Septum:     Appears normal  Cardiac Rhythm:        Normal                 Cardiac Axis:           Normal  Cardiac Situs:         Appears normal         Diaphragm:              Appears normal  Rt Outflow Tract:      Previously seen        3 Vessel View:          Appears normal  Lt Outflow Tract:      Appears normal         3 V Trachea View:       Appears normal  Aortic  Arch:           Appears normal         IVC:                    Appears normal  Ductal Arch:           Appears normal         Crossing:               Appears normal  SVC:                   Appears  normal  Abdomen  Ventral Wall:          Previously seen        Lt Kidney:              Appears normal  Cord Insertion:        Previously seen        Rt Kidney:              Appears normal  Situs:                 Previously seen        Bladder:                Appears normal  Stomach:               Appears normal  Extremities  Lt Humerus:            Previously seen        Lt Femur:               Previously seen  Rt Humerus:            Previously seen        Rt Femur:               Previously seen  Lt Forearm:            Previously seen        Lt Lower Leg:           Previously seen  Rt Forearm:            Previously seen        Rt Lower Leg:           Previously seen  Lt Hand:               Previously seen        Lt Foot:                Previously seen  Rt Hand:               Previously seen        Rt Foot:                Previously seen  Other  Umbilical Cord:        Previously seen        Genitalia:              Female prev seen ---------------------------------------------------------------------- Cervix Uterus Adnexa  Cervix  Normal appearance by transabdominal scan  Uterus  No abnormality visualized.  Right Ovary  Size(cm)     1.96   x   1.29   x  1.41      Vol(ml): 1.87  Within normal limits.  Left Ovary  Size(cm)     1.75   x   1.95  x  1.31      Vol(ml): 2.34  Within normal limits.  Cul De Sac  No free fluid seen.  Adnexa  No adnexal mass visualized ---------------------------------------------------------------------- Comments  Erin Whitney is currently at 24 weeks and 5 days.  She has  been followed due to history of gestational hypertension in  her prior pregnancy.  The views of the fetal anatomy were  unable to be fully visualized during her last exam.  She denies any problems since her last exam.  Her blood  pressure today was 146/70.  Sonographic findings  Single intrauterine pregnancy at 24w 5d.  Fetal cardiac activity:  Observed and appears normal.  Presentation: Transverse, head to maternal right.   The views of the fetal anatomy were visualized today.  There  were no obvious fetal anomalies noted.  Fetal biometry shows the estimated fetal weight of 1 pound 7  ounces which measures at the 18th percentile.  Amniotic fluid volume: Within normal limits. MVP: 3.67 cm.  Placenta: Anterior.  The patient was informed that anomalies may be missed due  to technical limitations. If the fetus is in a suboptimal position  or maternal habitus is increased, visualization of the fetus in  the maternal uterus may be impaired.  History of gestational hypertension in her prior pregnancy  The increased risk of preeclampsia should she develop  gestational hypertension again in her current pregnancy was  discussed.  She should continue taking a daily baby aspirin  for  preeclampsia prophylaxis.  Due to her history of gestational hypertension, follow-up  growth scan was scheduled in the third trimester (in 8 weeks).  The patient stated that all of her questions were answered  today.  A total of 20 minutes was spent counseling and coordinating  the care for this patient.  Greater than 50% of the time was  spent in direct face-to-face contact. ----------------------------------------------------------------------                   Steffan Keys, MD Electronically Signed Final Report   03/28/2024 10:20 am ----------------------------------------------------------------------   US  MFM OB DETAIL +14 WK Result Date: 02/22/2024 ----------------------------------------------------------------------  OBSTETRICS REPORT                       (Signed Final 02/22/2024 10:36 am) ---------------------------------------------------------------------- Patient Info  ID #:       [id number]                          D.O.B.:  1990/09/04 (33 yrs)(F)  Name:       Erin Whitney                 Visit Date: 02/22/2024 07:17 am ---------------------------------------------------------------------- Performed By  Attending:        Steffan Keys MD         Ref.  Address:     [address]                                                             [address]  Performed By:     Jonette Nap        Location[facility]  Referred By:      [facility] ---------------------------------------------------------------------- Orders  #  Description                           Code        Ordered By  1  US  MFM OB DETAIL +14 WK               23188.98    GLORIS HUGGER ----------------------------------------------------------------------  #  Order #                     Accession #                Episode #  1  [id number]                   [id number]                 [id number] ---------------------------------------------------------------------- Indications  Obesity complicating pregnancy, second         O99.212  trimester (PG BMI 32)  Poor obstetric history: Previous               O09.299  preeclampsia / eclampsia/gestational HTN  [redacted] weeks gestation of pregnancy                Z3A.19  Encounter for antenatal screening for          Z36.3  malformations  LR female, neg Horizon ---------------------------------------------------------------------- Fetal Evaluation  Num Of Fetuses:         1  Fetal Heart Rate(bpm):  155  Cardiac Activity:       Observed  Presentation:           Variable  Placenta:               Anterior  P. Cord Insertion:      Marginal insertion  Amniotic Fluid  AFI FV:      Within normal limits                              Largest Pocket(cm)                              6.73 ---------------------------------------------------------------------- Biometry  BPD:      44.6  mm     G. Age:  19w 3d         40  %  CI:        73.34   %    70 - 86                                                          FL/HC:      16.1   %    16.8 - 19.8  HC:       165.5  mm     G. Age:  19w 2d         22  %    HC/AC:      1.09        1.09 - 1.39  AC:      151.5  mm     G. Age:  20w 3d         67  %    FL/BPD:     59.6   %  FL:       26.6  mm     G. Age:  18w 1d        3.9  %    FL/AC:      17.6   %    20 - 24  HUM:      27.2  mm     G. Age:  18w 5d         25  %  CER:      20.2  mm     G. Age:  19w 3d         55  %  NFT:       3.2  mm  LV:        8.3  mm  CM:        5.3  mm  Est. FW:     286  gm    0 lb 10 oz      25  % ---------------------------------------------------------------------- OB History  Gravidity:    2         Term:   1        Prem:   0        SAB:   0  TOP:          0       Ectopic:  0        Living: 1 ---------------------------------------------------------------------- Gestational Age  LMP:           19w 5d        Date:  10/07/23                  EDD:   07/13/24  U/S Today:     19w 2d                                        EDD:   07/16/24  Best:          19w 5d     Det. By:  LMP  (10/07/23)          EDD:   07/13/24 ---------------------------------------------------------------------- Targeted Anatomy  Central Nervous System  Calvarium/Cranial V.:  Appears normal         Cereb./Vermis:          Appears normal  Cavum:  Appears normal         Cisterna Magna:         Appears normal  Lateral Ventricles:    Appears normal         Midline Falx:           Appears normal  Choroid Plexus:        Appears normal  Spine  Cervical:              Appears normal         Sacral:                 Appears normal  Thoracic:              Appears normal         Shape/Curvature:        Appears normal  Lumbar:                Appears normal  Head/Neck  Lips:                  Not well visualized    Profile:                Appears normal  Neck:                  Appears normal         Orbits/Eyes:            Appears normal  Nuchal Fold:           Appears normal         Mandible:               Appears normal  Nasal Bone:            Present                Maxilla:                 Appears normal  Thorax  4 Chamber View:        Not well visualized    Interventr. Septum:     Appears normal  Cardiac Rhythm:        Normal                 Cardiac Axis:           Normal  Cardiac Situs:         Not well visualized    Diaphragm:              Appears normal  Rt Outflow Tract:      Appears normal         3 Vessel View:          Appears normal  Lt Outflow Tract:      Not well visualized    3 V Trachea View:       Appears normal  Aortic Arch:           Appears normal         IVC:                    Appears normal  Ductal Arch:           Not well visualized    Crossing:               Appears normal  SVC:  Appears normal  Abdomen  Ventral Wall:          Appears normal         Lt Kidney:              Appears normal  Cord Insertion:        Appears normal         Rt Kidney:              Appears normal  Situs:                 Appears normal         Bladder:                Appears normal  Stomach:               Appears normal  Extremities  Lt Humerus:            Appears normal         Lt Femur:               Appears normal  Rt Humerus:            Appears normal         Rt Femur:               Appears normal  Lt Forearm:            Appears normal         Lt Lower Leg:           Appears normal  Rt Forearm:            Appears normal         Rt Lower Leg:           Appears normal  Lt Hand:               Open hand nml          Lt Foot:                Nml heel/foot  Rt Hand:               Open hand nml          Rt Foot:                Nml heel/foot  Other  Umbilical Cord:        Normal 3-vessel        Genitalia:              Female-nml ---------------------------------------------------------------------- Cervix Uterus Adnexa  Cervix  Length:            3.7  cm.  Normal appearance by transabdominal scan  Uterus  No abnormality visualized.  Right Ovary  Not visualized.  Left Ovary  Size(cm)     1.99   x   2.15   x  1.36      Vol(ml): 3.05  Within normal limits.  Cul De Sac  No free fluid seen.   Adnexa  No abnormality visualized ---------------------------------------------------------------------- Comments  Erin Whitney is currently at 19 weeks and 5 days.  She was  seen for a detailed fetal anatomy scan.  Her prior pregnancy  was complicated by gestational hypertension.  The patient reports that this pregnancy was conceived with  an IUD in place.  The IUD was removed at around 7 weeks of  her current pregnancy.  She denies any significant past medical history and  denies  any problems in her current pregnancy.  Her blood pressure  today was 128/72.  She had a cell free DNA test earlier in her pregnancy which  indicated a low risk for trisomy 29, 16, and 13. A female fetus  is predicted.  Sonographic findings  Single intrauterine pregnancy at 19w 5d  Fetal cardiac activity:  Observed and appears normal.  Presentation: Variable.  The anatomic structures that were well seen appear normal  without evidence of soft markers. Due to poor acoustic  windows some structures remain suboptimally visualized.  Fetal biometry shows the estimated fetal weight at the 25th  percentile.  Amniotic fluid: Within normal limits.  MVP: 6.73 cm.  Placenta: Anterior.  Adnexa: No abnormality visualized.  Cervical length: 3.7 cm.  The patient was informed that anomalies may be missed due  to technical limitations. If the fetus is in a suboptimal position  or maternal habitus is increased, visualization of the fetus in  the maternal uterus may be impaired.  A marginal cord insertion was noted on today's exam.  The  small risk of IUGR associated with a marginal placental cord  insertion was discussed.  We will continue to follow her with growth ultrasounds  throughout her pregnancy due to this indication.  History of gestational hypertension in her prior pregnancy  The increased risk of preeclampsia should she develop  gestational hypertension again in her current pregnancy was  discussed.  She should continue taking a daily baby  aspirin  for  preeclampsia prophylaxis.  A follow-up exam was scheduled in 5 weeks to reassess the  views of the fetal anatomy and fetal growth.  The patient stated that all of her questions were answered  today.  A total of 30 minutes was spent counseling and coordinating  the care for this patient.  Greater than 50% of the time was  spent in direct face-to-face contact. ----------------------------------------------------------------------                   Steffan Keys, MD Electronically Signed Final Report   02/22/2024 10:36 am ----------------------------------------------------------------------     Assessment and Plan:  Pregnancy: G2P1001 at [redacted]w[redacted]d 1. Marginal insertion of umbilical cord affecting management of mother (Primary) Follow up scans as per MFM, and follow their recommendations.  2. History of gestational hypertension Stable BP, on Aspirin   3. Need for diphtheria-tetanus-pertussis (Tdap) vaccine - Tdap vaccine greater than or equal to 7yo IM given  4. [redacted] weeks gestation of pregnancy 5. Supervision of other normal pregnancy, antepartum Third trimester labs today, will follow up results and manage accordingly. Third trimester expectations reviewed and all questions answered. - Glucose Tolerance, 2 Hours w/1 Hour - CBC - RPR W/RFLX TO RPR TITER, TREPONEMAL AB, SCREEN AND DIAGNOSIS - HIV Antibody (routine testing w rflx) - Comprehensive metabolic panel with GFR  Preterm labor symptoms and general obstetric precautions including but not limited to vaginal bleeding, contractions, leaking of fluid and fetal movement were reviewed in detail with the patient. Please refer to After Visit Summary for other counseling recommendations.   Return in about 11 days (around 05/09/2024) for OB visit as scheduled.  Future Appointments  Date Time Provider Department Center  05/09/2024  8:35 AM Fredirick Glenys RAMAN, MD [facility]  05/23/2024  7:15 AM [facility] PROVIDER 1 [facility]   05/23/2024  7:30 AM [facility] US1 [facility]  05/23/2024  3:50 PM Ho Parisi, Gloris LABOR, MD [facility]  06/06/2024  8:35 AM Izell Harari, MD [facility]  02/27/2025  8:30 AM Jackquline Sawyer, MD [facility] None    Gloris Hugger, MD

## 2024-04-29 ENCOUNTER — Ambulatory Visit: Payer: Self-pay | Admitting: Obstetrics & Gynecology

## 2024-04-29 DIAGNOSIS — Z3480 Encounter for supervision of other normal pregnancy, unspecified trimester: Secondary | ICD-10-CM

## 2024-04-29 LAB — COMPREHENSIVE METABOLIC PANEL WITH GFR
ALT: 14 IU/L (ref 0–32)
AST: 18 IU/L (ref 0–40)
Albumin: 3.9 g/dL (ref 3.9–4.9)
Alkaline Phosphatase: 86 IU/L (ref 41–116)
BUN/Creatinine Ratio: 9 (ref 9–23)
BUN: 7 mg/dL (ref 6–20)
Bilirubin Total: 0.3 mg/dL (ref 0.0–1.2)
CO2: 19 mmol/L — ABNORMAL LOW (ref 20–29)
Calcium: 9 mg/dL (ref 8.7–10.2)
Chloride: 102 mmol/L (ref 96–106)
Creatinine, Ser: 0.81 mg/dL (ref 0.57–1.00)
Globulin, Total: 2.7 g/dL (ref 1.5–4.5)
Glucose: 73 mg/dL (ref 70–99)
Potassium: 4.6 mmol/L (ref 3.5–5.2)
Sodium: 135 mmol/L (ref 134–144)
Total Protein: 6.6 g/dL (ref 6.0–8.5)
eGFR: 98 mL/min/1.73 (ref >59)

## 2024-04-29 LAB — GLUCOSE TOLERANCE, 2 HOURS W/ 1HR
Glucose, 1 hour: 135 mg/dL (ref 70–179)
Glucose, 2 hour: 131 mg/dL (ref 70–152)
Glucose, Fasting: 70 mg/dL (ref 70–91)

## 2024-04-29 LAB — CBC
Hematocrit: 38.9 % (ref 34.0–46.6)
Hemoglobin: 13 g/dL (ref 11.1–15.9)
MCH: 31.3 pg (ref 26.6–33.0)
MCHC: 33.4 g/dL (ref 31.5–35.7)
MCV: 94 fL (ref 79–97)
Platelets: 214 x10E3/uL (ref 150–450)
RBC: 4.15 x10E6/uL (ref 3.77–5.28)
RDW: 11.7 % (ref 11.7–15.4)
WBC: 9.2 x10E3/uL (ref 3.4–10.8)

## 2024-04-29 LAB — SYPHILIS: RPR W/REFLEX TO RPR TITER AND TREPONEMAL ANTIBODIES, TRADITIONAL SCREENING AND DIAGNOSIS ALGORITHM: RPR Ser Ql: NONREACTIVE

## 2024-04-29 LAB — HIV ANTIBODY (ROUTINE TESTING W REFLEX): HIV Screen 4th Generation wRfx: NONREACTIVE

## 2024-05-09 ENCOUNTER — Encounter: Admitting: Family Medicine

## 2024-05-17 ENCOUNTER — Ambulatory Visit (INDEPENDENT_AMBULATORY_CARE_PROVIDER_SITE_OTHER): Admitting: Obstetrics and Gynecology

## 2024-05-17 VITALS — BP 118/78 | HR 105 | Wt 241.1 lb

## 2024-05-17 DIAGNOSIS — Z3A31 31 weeks gestation of pregnancy: Secondary | ICD-10-CM | POA: Diagnosis not present

## 2024-05-17 DIAGNOSIS — O43193 Other malformation of placenta, third trimester: Secondary | ICD-10-CM | POA: Diagnosis not present

## 2024-05-17 DIAGNOSIS — Z3480 Encounter for supervision of other normal pregnancy, unspecified trimester: Secondary | ICD-10-CM

## 2024-05-17 DIAGNOSIS — Z8759 Personal history of other complications of pregnancy, childbirth and the puerperium: Secondary | ICD-10-CM

## 2024-05-17 DIAGNOSIS — O43199 Other malformation of placenta, unspecified trimester: Secondary | ICD-10-CM

## 2024-05-17 DIAGNOSIS — Z23 Encounter for immunization: Secondary | ICD-10-CM | POA: Diagnosis not present

## 2024-05-17 NOTE — Progress Notes (Signed)
"  ° °  LOW-RISK PREGNANCY OFFICE VISIT Patient name: Erin Whitney MRN [id number]  Date of birth: [birth year] Chief Complaint:   Routine Prenatal Visit  History of Present Illness:   BOLUWATIFE MUTCHLER is a 33 y.o. G25P1001 female at [redacted]w[redacted]d with an Estimated Date of Delivery: 07/13/24 being seen today for ongoing management of a low-risk pregnancy.  Today she reports some BH ctxs while running up and down stairs to gifts situated on Christmas Eve, but none since. Contractions: Not present. Vag. Bleeding: None.  Movement: Present. denies leaking of fluid. Review of Systems:   Pertinent items are noted in HPI Denies abnormal vaginal discharge w/ itching/odor/irritation, headaches, visual changes, shortness of breath, chest pain, abdominal pain, severe nausea/vomiting, or problems with urination or bowel movements unless otherwise stated above. Pertinent History Reviewed:  Reviewed past medical,surgical, social, obstetrical and family history.  Reviewed problem list, medications and allergies. Physical Assessment:   Vitals:   05/17/24 1109  BP: 118/78  Pulse: (!) 105  Weight: 241 lb 2 oz (109.4 kg)  Body mass index is 34.6 kg/m.        Physical Examination:   General appearance: Well appearing, and in no distress  Mental status: Alert, oriented to person, place, and time  Skin: Warm & dry  Cardiovascular: Normal heart rate noted  Respiratory: Normal respiratory effort, no distress  Abdomen: Soft, gravid, nontender  Pelvic: Cervical exam deferred         Extremities: Edema: None  Fetal Status: Fetal Heart Rate (bpm): 135 (Simultaneous filing. User may not have seen previous data.) Fundal Height: 30 cm Movement: Present    No results found for this or any previous visit (from the past 24 hours).  Assessment & Plan:  1) Low-risk pregnancy G2P1001 at [redacted]w[redacted]d with an Estimated Date of Delivery: 07/13/24   2) Supervision of other normal pregnancy, antepartum (Primary) - Doing well - Good vigorous  FM - Explained that increased activity such as going up and down stairs and/or dehydration can cause BH ctxs. As long as not painful and goes away, normal discomfort of pregnancy  3) Marginal insertion of umbilical cord affecting management of mother - Scheduled for growth U/S Tuesday 05/23/2024  4) History of gestational hypertension - Stable BP  5) Need for Tdap vaccination - Tdap vaccine greater than or equal to 7yo IM  6) [redacted] weeks gestation of pregnancy    Meds: No orders of the defined types were placed in this encounter.  Labs/procedures today: TdaP  Plan:  Continue routine obstetrical care   Reviewed: Preterm labor symptoms and general obstetric precautions including but not limited to vaginal bleeding, contractions, leaking of fluid and fetal movement were reviewed in detail with the patient.  All questions were answered. Has home bp cuff. Check bp weekly, let us  know if >140/90.   Follow-up: Return in about 2 weeks (around 05/31/2024) for Return OB visit.  Orders Placed This Encounter  Procedures   Tdap vaccine greater than or equal to 7yo IM   Kampbell Holaway MSN, CNM 05/17/2024 11:28 AM "

## 2024-05-23 ENCOUNTER — Encounter: Payer: Self-pay | Admitting: Obstetrics & Gynecology

## 2024-05-23 ENCOUNTER — Ambulatory Visit: Attending: Obstetrics | Admitting: Obstetrics

## 2024-05-23 ENCOUNTER — Ambulatory Visit (HOSPITAL_BASED_OUTPATIENT_CLINIC_OR_DEPARTMENT_OTHER)

## 2024-05-23 ENCOUNTER — Ambulatory Visit (INDEPENDENT_AMBULATORY_CARE_PROVIDER_SITE_OTHER): Admitting: Obstetrics & Gynecology

## 2024-05-23 VITALS — BP 142/76

## 2024-05-23 VITALS — BP 120/81 | HR 96 | Wt 243.0 lb

## 2024-05-23 VITALS — BP 125/74

## 2024-05-23 DIAGNOSIS — Z8759 Personal history of other complications of pregnancy, childbirth and the puerperium: Secondary | ICD-10-CM

## 2024-05-23 DIAGNOSIS — O358XX0 Maternal care for other (suspected) fetal abnormality and damage, not applicable or unspecified: Secondary | ICD-10-CM | POA: Insufficient documentation

## 2024-05-23 DIAGNOSIS — Z3A32 32 weeks gestation of pregnancy: Secondary | ICD-10-CM | POA: Diagnosis not present

## 2024-05-23 DIAGNOSIS — O43193 Other malformation of placenta, third trimester: Secondary | ICD-10-CM | POA: Diagnosis not present

## 2024-05-23 DIAGNOSIS — E669 Obesity, unspecified: Secondary | ICD-10-CM

## 2024-05-23 DIAGNOSIS — O99213 Obesity complicating pregnancy, third trimester: Secondary | ICD-10-CM | POA: Insufficient documentation

## 2024-05-23 DIAGNOSIS — O09293 Supervision of pregnancy with other poor reproductive or obstetric history, third trimester: Secondary | ICD-10-CM

## 2024-05-23 DIAGNOSIS — Z3480 Encounter for supervision of other normal pregnancy, unspecified trimester: Secondary | ICD-10-CM

## 2024-05-23 DIAGNOSIS — O43199 Other malformation of placenta, unspecified trimester: Secondary | ICD-10-CM

## 2024-05-23 NOTE — Patient Instructions (Addendum)
 Return to office for any scheduled appointments. Call the office or go to the MAU at [facility] if: You begin to have strong, frequent contractions Your water breaks.  Sometimes it is a big gush of fluid, sometimes it is just a trickle that keeps getting your underwear wet or running down your legs You have vaginal bleeding.  It is normal to have a small amount of spotting if your cervix was checked.  You do not feel your baby moving like normal.  If you do not, get something to eat and drink and lay down and focus on feeling your baby move.   If your baby is still not moving like normal, you should call the office or go to MAU. Any other obstetric concerns.     RSV Vaccination for Pregnant People  CDC recommends two ways to protect babies from getting very sick with Respiratory Syncytial Virus (RSV):  An RSV vaccination given during pregnancy  Pfizer's vaccine Verdis Frederickson) is recommended for use during pregnancy. It is given during RSV season to people who are 32 through [redacted] weeks pregnant.  Or, An RSV immunization given directly to infants and some older babies  Babies born to mothers who get RSV vaccine at least 2 weeks before delivery will have protection and, in most cases, should not need an RSV immunization later.    When is RSV season?  In most regions of the [place] RSV season starts in the fall and peaks in the winter, but the timing and severity of RSV season can vary from place to place and year to year.   The goal of maternal RSV vaccination is to protect babies from getting very sick with RSV during their first RSV season.  In most of the [place], this means maternal RSV vaccine will be given in September through January.  Who should get the maternal RSV vaccine?  People who are 79 through [redacted] weeks pregnant during September through January should get one dose of maternal RSV vaccine to protect their babies. RSV season  can vary around the country.   How is the maternal RSV vaccine administered?  Maternal RSV vaccine is given as a shot into the mother's upper arm. Only a single dose (one shot) of maternal RSV vaccine is recommended.   It is not yet known whether another dose might be needed in later pregnancies.  How well does the maternal RSV vaccine work?  When someone gets RSV vaccine, their body responds by making a protein that protects against the virus that causes RSV. The process takes about 2 weeks. When a pregnant person gets RSV vaccine, their protective proteins (called antibodies) also pass to their baby. So, babies who are born at least 2 weeks after their mother gets RSV vaccine are protected at birth, when infants are at the highest risk of severe RSV disease.   The vaccine can reduce a baby's risk of being hospitalized from RSV by 57% in the first six months after birth.  What are the possible side effects of the maternal RSV vaccine?  In the clinical trials, the side effects most often reported by pregnant people who received the maternal RSV vaccine were pain at the injection site, headache, muscle pain, and nausea.  Although not common, a dangerous high blood pressure condition called pre-eclampsia occurred in 1.8% of pregnant people who received the maternal RSV vaccine compared to 1.4% of pregnant people who received a placebo.  The clinical  trials identified a small increase in the number of preterm births in vaccinated pregnant people. It is not clear if this is a true safety problem related to RSV vaccine or if this occurred for reasons unrelated to vaccination.  To reduce the potential risk of preterm birth and complications from RSV disease, FDA approved the maternal RSV vaccine for use during weeks 32 through 30 of pregnancy while additional studies are conducted.  FDA is requiring the manufacturer to do additional studies that will look more closely at the potential risk of  preterm births and pregnancy-related high blood pressure issues in mothers, including pre-eclampsia.  Severe allergic reactions to vaccines are rare but can happen after any vaccine and can be life-threatening. If you see signs of a severe allergic reaction after vaccination (hives, swelling of the face and throat, difficulty breathing, a fast heartbeat, dizziness, or weakness), seek immediate medical care by calling 911.  As with any medicine or vaccine there is a very remote chance of the vaccine causing other serious injury or death after vaccination.  Adverse events following vaccination should be reported to the Vaccine Adverse Event Reporting System (VAERS), even if it's not clear that the vaccine caused the adverse event. You or your doctor can report an adverse event to [employer] and FDA through VAERS. If you need further assistance reporting to VAERS, please email [email or web address] [email or web address] or call [phone].  If you have any questions about side effects from the maternal RSV vaccine, talk with your healthcare provider.  Do I need a prescription for a maternal RSV vaccine?  Until the vaccine available in the office, you will need a prescription to take to a local pharmacy that is providing the vaccine.   How do I pay for the maternal RSV vaccine?  Most private health insurance plans cover the maternal RSV vaccine, but there may be a cost to you depending on your plan.  Contact your insurer to find out.  Medicaid Beginning February 15, 2022, most people with coverage from [facility] and [employer] [facility]) will be guaranteed coverage of all vaccines recommended by the Advisory Committee on Immunization Practice at no cost to them.   Source: [facility] for Immunization and Respiratory Diseases

## 2024-05-23 NOTE — Progress Notes (Signed)
 MFM Consult Note  Erin Whitney is currently at [redacted]w[redacted]d. She has been followed due to a history of gestational hypertension in her prior pregnancy and a marginal placental cord insertion noted on her prior ultrasound exam.    She denies any problems since her last exam and has screened negative for gestational diabetes.   Her blood pressure today was 125/74.  Sonographic findings Single intrauterine pregnancy at 32w 5d.  Fetal cardiac activity:  Observed and appears normal. Presentation: Transverse, head to maternal left. Fetal biometry shows the estimated fetal weight of 4 lb 15 oz,  2249g (71%). Amniotic fluid volume: Within normal limits. AFI: 22.21cm.  MVP: 7.85 cm. Placenta: Anterior.  As the fetal growth is within normal limits today and as her blood pressures are within normal limits, no further exams were scheduled in our office.  Preeclampsia precautions were reviewed.  The patient stated that all of her questions were answered.   A total of 20 minutes was spent counseling and coordinating the care for this patient.  Greater than 50% of the time was spent in direct face-to-face contact.

## 2024-05-23 NOTE — Progress Notes (Signed)
 "  PRENATAL VISIT NOTE  Subjective:  Erin Whitney is a 34 y.o. G2P1001 at [redacted]w[redacted]d being seen today for ongoing prenatal care.  She is currently monitored for the following issues for this low-risk pregnancy and has Exercise-induced asthma; Seborrheic dermatitis of scalp; Class 1 obesity due to excess calories without serious comorbidity with body mass index (BMI) of 33.0 to 33.9 in adult; Sleep disorder; Mass of upper outer quadrant of right breast; Supervision of other normal pregnancy, antepartum; History of gestational hypertension; Marginal insertion of umbilical cord affecting management of mother; and BMI 34.0-34.9,adult on their problem list.  Patient reports no complaints.  Contractions: Irregular. Vag. Bleeding: None.  Movement: Present. Denies leaking of fluid.   The following portions of the patient's history were reviewed and updated as appropriate: allergies, current medications, past family history, past medical history, past social history, past surgical history and problem list.   Objective:   Vitals:   05/23/24 1555  BP: 120/81  Pulse: 96  Weight: 243 lb (110.2 kg)    Fetal Status:  Fetal Heart Rate (bpm): 140   Movement: Present    General: Alert, oriented and cooperative. Patient is in no acute distress.  Skin: Skin is warm and dry. No rash noted.   Cardiovascular: Normal heart rate noted  Respiratory: Normal respiratory effort, no problems with respiration noted  Abdomen: Soft, gravid, appropriate for gestational age.  Pain/Pressure: Absent     Pelvic: Cervical exam deferred        Extremities: Normal range of motion.     Mental Status: Normal mood and affect. Normal behavior. Normal judgment and thought content.      12/21/2023    9:32 AM 10/21/2023    8:02 AM 06/15/2023   12:40 PM  Depression screen PHQ 2/9  Decreased Interest 0 0 0  Down, Depressed, Hopeless 0 0 0  PHQ - 2 Score 0 0 0  Altered sleeping 1 0 1  Tired, decreased energy 1 1 1   Change in  appetite 0 0 0  Feeling bad or failure about yourself  0 0 0  Trouble concentrating 0 0 0  Moving slowly or fidgety/restless 0 0 0  Suicidal thoughts 0 0 0  PHQ-9 Score 2  1  2    Difficult doing work/chores Not difficult at all Not difficult at all Not difficult at all     Data saved with a previous flowsheet row definition        12/21/2023    9:33 AM 10/21/2023    8:02 AM 06/15/2023   12:40 PM 09/02/2021    3:17 PM  GAD 7 : Generalized Anxiety Score  Nervous, Anxious, on Edge 1 1 1 1   Control/stop worrying 0 0 2 1  Worry too much - different things 0 0 2 1  Trouble relaxing 1 0 1 1  Restless 0 0 0 0  Easily annoyed or irritable 0 1 1 0  Afraid - awful might happen 0 0 2 1  Total GAD 7 Score 2 2 9 5   Anxiety Difficulty Not difficult at all Not difficult at all Somewhat difficult Somewhat difficult   US  MFM OB FOLLOW UP Result Date: 05/23/2024 ----------------------------------------------------------------------  OBSTETRICS REPORT                       (Signed Final 05/23/2024 11:07 am) ---------------------------------------------------------------------- Patient Info  ID #:       [id number]  D.O.B.:  1990-06-04 (33 yrs)(F)  Name:       Erin Whitney                 Visit Date: 05/23/2024 07:13 am ---------------------------------------------------------------------- Performed By  Attending:        Steffan Keys MD         Ref. Address:     [address]                                                             [address]  Performed By:     Jonette Nap        [facility]  Referred By:      [facility] ---------------------------------------------------------------------- Orders  #  Description                           Code         Ordered By  1  US  MFM OB FOLLOW UP                   [phone]    BABARA KEYS ----------------------------------------------------------------------  #  Order #                     Accession #                Episode #  1  [id number]                   [id number]                 [id number] ---------------------------------------------------------------------- Indications  Obesity complicating pregnancy, third          O99.213  trimester (PG BMI 32)  Poor obstetric history: Previous gestational   O09.299  HTN  [redacted] weeks gestation of pregnancy                Z3A.32  LR female, neg Horizon ---------------------------------------------------------------------- Fetal Evaluation  Num Of Fetuses:         1  Fetal Heart Rate(bpm):  153  Cardiac Activity:  Observed  Presentation:           Transverse, head to maternal left  Placenta:               Anterior  P. Cord Insertion:      Previously seen  Amniotic Fluid  AFI FV:      Within normal limits  AFI Sum(cm)     %Tile       Largest Pocket(cm)  22.21           85          7.85  RUQ(cm)       RLQ(cm)       LUQ(cm)        LLQ(cm)  4.76          3.47          6.13           7.85 ---------------------------------------------------------------------- Biometry  BPD:      85.1  mm     G. Age:  34w 2d         85  %    CI:        74.56   %    70 - 86                                                          FL/HC:      19.1   %    19.9 - 21.5  HC:      312.8  mm     G. Age:  35w 0d         74  %    HC/AC:      1.02        0.96 - 1.11  AC:      306.5  mm     G. Age:  34w 4d         93  %    FL/BPD:     70.4   %    71 - 87  FL:       59.9  mm     G. Age:  31w 1d          8  %    FL/AC:      19.5   %    20 - 24  LV:        4.7  mm  Est. FW:    2249  gm    4 lb 15 oz      71  %  Est. FW at 39 Wks:       3675  gm     8 lb 2 oz ---------------------------------------------------------------------- OB History  Gravidity:    2         Term:   1        Prem:   0        SAB:   0  TOP:          0        Ectopic:  0        Living: 1 ---------------------------------------------------------------------- Gestational Age  LMP:           32w 5d        Date:  10/07/23  EDD:   07/13/24  U/S Today:     33w 5d                                        EDD:   07/06/24  Best:          32w 5d     Det. By:  LMP  (10/07/23)          EDD:   07/13/24 ---------------------------------------------------------------------- Targeted Anatomy  Central Nervous System  Calvarium/Cranial V.:  Appears normal         Cereb./Vermis:          Previously seen  Cavum:                 Appears normal         Cisterna Magna:         Previously seen  Lateral Ventricles:    Appears normal         Midline Falx:           Previously seen  Choroid Plexus:        Previously seen  Spine  Cervical:              Previously seen        Sacral:                 Previously seen  Thoracic:              Previously seen        Shape/Curvature:        Previously seen  Lumbar:                Previously seen  Head/Neck  Lips:                  Appears normal         Profile:                Previously seen  Neck:                  Previously seen        Orbits/Eyes:            Previously seen  Nuchal Fold:           Previously seen        Mandible:               Previously seen  Nasal Bone:            Previously seen        Maxilla:                Previously seen  Thorax  4 Chamber View:        Previously seen        Interventr. Septum:     Previously seen  Cardiac Rhythm:        Normal                 Cardiac Axis:           Previously seen  Cardiac Situs:         Previously seen        Diaphragm:              Appears normal  Rt Outflow Tract:      Previously seen  3 Vessel View:          Previously seen  Lt Outflow Tract:      Previously seen        3 V Trachea View:       Previously seen  Aortic Arch:           Previously seen        IVC:                    Previously seen  Ductal Arch:           Previously seen        Crossing:                Previously seen  SVC:                   Previously seen  Abdomen  Ventral Wall:          Previously seen        Lt Kidney:              Appears normal  Cord Insertion:        Previously seen        Rt Kidney:              Appears normal  Situs:                 Previously seen        Bladder:                Appears normal  Stomach:               Appears normal  Extremities  Lt Humerus:            Previously seen        Lt Femur:               Previously seen  Rt Humerus:            Previously seen        Rt Femur:               Previously seen  Lt Forearm:            Previously seen        Lt Lower Leg:           Previously seen  Rt Forearm:            Previously seen        Rt Lower Leg:           Previously seen  Lt Hand:               Previously seen        Lt Foot:                Previously seen  Rt Hand:               Previously seen        Rt Foot:                Previously seen  Other  Umbilical Cord:        Previously seen        Genitalia:              Female prev seen ---------------------------------------------------------------------- Comments  Sonographic findings  Single intrauterine pregnancy at 32w 5d.  Fetal cardiac activity:  Observed and appears normal.  Presentation:  Transverse, head to maternal left.  Fetal biometry shows the estimated fetal weight of 4 lb 15 oz,  2249g (71%).  Amniotic fluid volume: Within normal limits. AFI: 22.21cm.  MVP: 7.85 cm.  Placenta: Anterior.  There are limitations of prenatal ultrasound such as the  inability to detect certain abnormalities due to poor  visualization. Various factors such as fetal position,  gestational age and maternal body habitus may increase the  difficulty in visualizing the fetal anatomy.  Recommendations  - See Epic note for assessment and plan of care. Any  referring office that does not utilize Epic will recieve a copy of  today's consult note via fax. Please contact our office with  any concerns.  ----------------------------------------------------------------------                  Steffan Keys, MD Electronically Signed Final Report   05/23/2024 11:07 am ----------------------------------------------------------------------     Assessment and Plan:  Pregnancy: G2P1001 at [redacted]w[redacted]d 1. Marginal insertion of umbilical cord affecting management of mother (Primary) Normal growth, reassured.  2. History of gestational hypertension Stable BP, on ASA  3. [redacted] weeks gestation of pregnancy 4. Supervision of other normal pregnancy, antepartum Information given about RSV vaccine, will get at next appointment. Preterm labor symptoms and general obstetric precautions including but not limited to vaginal bleeding, contractions, leaking of fluid and fetal movement were reviewed in detail with the patient. Please refer to After Visit Summary for other counseling recommendations.   Return in about 2 weeks (around 06/06/2024) for OFFICE OB VISIT (MD only).  Future Appointments  Date Time Provider Department Center  06/06/2024  8:35 AM Izell Harari, MD [facility]  06/21/2024 10:55 AM Emilio Delilah HERO, CNM [facility]  06/28/2024  8:35 AM Emilio Delilah HERO, CNM [facility]  07/04/2024  9:35 AM Herchel, Gloris LABOR, MD [facility]  10/13/2024 10:20 AM Corwin Antu, FNP [facility] [address]  02/27/2025  8:30 AM Jackquline Sawyer, MD [facility] None    Gloris Herchel, MD  "

## 2024-06-06 ENCOUNTER — Ambulatory Visit (INDEPENDENT_AMBULATORY_CARE_PROVIDER_SITE_OTHER): Admitting: Obstetrics and Gynecology

## 2024-06-06 VITALS — BP 131/83 | HR 108 | Wt 241.0 lb

## 2024-06-06 DIAGNOSIS — O43193 Other malformation of placenta, third trimester: Secondary | ICD-10-CM

## 2024-06-06 DIAGNOSIS — Z3A34 34 weeks gestation of pregnancy: Secondary | ICD-10-CM

## 2024-06-06 DIAGNOSIS — O43199 Other malformation of placenta, unspecified trimester: Secondary | ICD-10-CM

## 2024-06-06 DIAGNOSIS — Z8759 Personal history of other complications of pregnancy, childbirth and the puerperium: Secondary | ICD-10-CM

## 2024-06-06 NOTE — Progress Notes (Signed)
 ROB

## 2024-06-06 NOTE — Progress Notes (Signed)
 "  PRENATAL VISIT NOTE  Subjective:  Erin Whitney is a 34 y.o. G2P1001 at [redacted]w[redacted]d being seen today for ongoing prenatal care.  She is currently monitored for the following issues for this low-risk pregnancy and has Exercise-induced asthma; Seborrheic dermatitis of scalp; Class 1 obesity due to excess calories without serious comorbidity with body mass index (BMI) of 33.0 to 33.9 in adult; Sleep disorder; Mass of upper outer quadrant of right breast; Supervision of other normal pregnancy, antepartum; History of gestational hypertension; Marginal insertion of umbilical cord affecting management of mother; and BMI 34.0-34.9,adult on their problem list.  Patient reports no complaints.  Contractions: Irregular. Vag. Bleeding: None.  Movement: Present. Denies leaking of fluid.   The following portions of the patient's history were reviewed and updated as appropriate: allergies, current medications, past family history, past medical history, past social history, past surgical history and problem list.   Objective:   Vitals:   06/06/24 0848  BP: 131/83  Pulse: (!) 108  Weight: 241 lb (109.3 kg)    Fetal Status:  Fetal Heart Rate (bpm): 156 Fundal Height: 35 cm Movement: Present Presentation: Vertex  General: Alert, oriented and cooperative. Patient is in no acute distress.  Skin: Skin is warm and dry. No rash noted.   Cardiovascular: Normal heart rate noted  Respiratory: Normal respiratory effort, no problems with respiration noted  Abdomen: Soft, gravid, appropriate for gestational age.  Pain/Pressure: Absent     Pelvic: Cervical exam deferred        Extremities: Normal range of motion.  Edema: None  Mental Status: Normal mood and affect. Normal behavior. Normal judgment and thought content.      12/21/2023    9:32 AM 10/21/2023    8:02 AM 06/15/2023   12:40 PM  Depression screen PHQ 2/9  Decreased Interest 0 0 0  Down, Depressed, Hopeless 0 0 0  PHQ - 2 Score 0 0 0  Altered sleeping 1 0  1  Tired, decreased energy 1 1 1   Change in appetite 0 0 0  Feeling bad or failure about yourself  0 0 0  Trouble concentrating 0 0 0  Moving slowly or fidgety/restless 0 0 0  Suicidal thoughts 0 0 0  PHQ-9 Score 2  1  2    Difficult doing work/chores Not difficult at all Not difficult at all Not difficult at all     Data saved with a previous flowsheet row definition        12/21/2023    9:33 AM 10/21/2023    8:02 AM 06/15/2023   12:40 PM 09/02/2021    3:17 PM  GAD 7 : Generalized Anxiety Score  Nervous, Anxious, on Edge 1  1  1  1    Control/stop worrying 0  0  2  1   Worry too much - different things 0  0  2  1   Trouble relaxing 1  0  1  1   Restless 0  0  0  0   Easily annoyed or irritable 0  1  1  0   Afraid - awful might happen 0  0  2  1   Total GAD 7 Score 2 2 9 5   Anxiety Difficulty Not difficult at all Not difficult at all Somewhat difficult Somewhat difficult     Data saved with a previous flowsheet row definition    Assessment and Plan:  Pregnancy: G2P1001 at [redacted]w[redacted]d 1. [redacted] weeks gestation of pregnancy (Primary) GBS next  visit  2. Marginal insertion of umbilical cord affecting management of mother 1/6: efw 71%, 2249g, ac 93%, afi 22>>repeat PRN  3. History of gestational hypertension  Preterm labor symptoms and general obstetric precautions including but not limited to vaginal bleeding, contractions, leaking of fluid and fetal movement were reviewed in detail with the patient. Please refer to After Visit Summary for other counseling recommendations.   No follow-ups on file.  Future Appointments  Date Time Provider Department Center  06/21/2024 10:55 AM Emilio Delilah HERO, CNM [facility]  06/28/2024  8:35 AM Emilio Delilah HERO, CNM [facility]  07/04/2024  9:35 AM Herchel, Gloris LABOR, MD [facility]  10/13/2024 10:20 AM Corwin Antu, FNP [facility] [address]  02/27/2025  8:30 AM Jackquline Sawyer, MD [facility] None    Bebe Furry,  MD  "

## 2024-06-21 ENCOUNTER — Ambulatory Visit: Admitting: Certified Nurse Midwife

## 2024-06-21 ENCOUNTER — Other Ambulatory Visit (HOSPITAL_COMMUNITY): Admission: RE | Admit: 2024-06-21 | Discharge: 2024-06-21 | Disposition: A | Source: Ambulatory Visit

## 2024-06-21 VITALS — BP 130/88 | HR 120 | Wt 245.0 lb

## 2024-06-21 DIAGNOSIS — Z9229 Personal history of other drug therapy: Secondary | ICD-10-CM | POA: Diagnosis not present

## 2024-06-21 DIAGNOSIS — O43193 Other malformation of placenta, third trimester: Secondary | ICD-10-CM

## 2024-06-21 DIAGNOSIS — Z3A36 36 weeks gestation of pregnancy: Secondary | ICD-10-CM | POA: Diagnosis not present

## 2024-06-21 DIAGNOSIS — Z3493 Encounter for supervision of normal pregnancy, unspecified, third trimester: Secondary | ICD-10-CM

## 2024-06-21 DIAGNOSIS — Z2911 Encounter for prophylactic immunotherapy for respiratory syncytial virus (RSV): Secondary | ICD-10-CM | POA: Diagnosis not present

## 2024-06-21 DIAGNOSIS — O43199 Other malformation of placenta, unspecified trimester: Secondary | ICD-10-CM

## 2024-06-21 NOTE — Progress Notes (Signed)
 Pt needs 36 week swabs today, would like cervix check.

## 2024-06-21 NOTE — Progress Notes (Signed)
 "  PRENATAL VISIT NOTE  Subjective:  Erin Whitney is a 34 y.o. G2P1001 at [redacted]w[redacted]d being seen today for ongoing prenatal care.  She is currently monitored for the following issues for this high-risk pregnancy and has Exercise-induced asthma; Seborrheic dermatitis of scalp; Class 1 obesity due to excess calories without serious comorbidity with body mass index (BMI) of 33.0 to 33.9 in adult; Sleep disorder; Mass of upper outer quadrant of right breast; Supervision of other normal pregnancy, antepartum; History of gestational hypertension; Marginal insertion of umbilical cord affecting management of mother; and BMI 34.0-34.9,adult on their problem list.  Patient reports no complaints.  Contractions: Irregular. Vag. Bleeding: None.  Movement: Present. Denies leaking of fluid.   The following portions of the patient's history were reviewed and updated as appropriate: allergies, current medications, past family history, past medical history, past social history, past surgical history and problem list.   Objective:   Vitals:   06/21/24 1130  BP: 130/88  Pulse: (!) 120  Weight: 245 lb (111.1 kg)    Fetal Status:  Fetal Heart Rate (bpm): 156 Fundal Height: 36 cm Movement: Present    General: Alert, oriented and cooperative. Patient is in no acute distress.  Skin: Skin is warm and dry. No rash noted.   Cardiovascular: Normal heart rate noted  Respiratory: Normal respiratory effort, no problems with respiration noted  Abdomen: Soft, gravid, appropriate for gestational age.  Pain/Pressure: Present     Pelvic: Cervical exam performed in the presence of a chaperone Dilation: Fingertip Effacement (%): Thick Station: -4 (Ballotable) S. Foster CMA   Extremities: Normal range of motion.     Mental Status: Normal mood and affect. Normal behavior. Normal judgment and thought content.      12/21/2023    9:32 AM 10/21/2023    8:02 AM 06/15/2023   12:40 PM  Depression screen PHQ 2/9  Decreased Interest 0 0  0  Down, Depressed, Hopeless 0 0 0  PHQ - 2 Score 0 0 0  Altered sleeping 1 0 1  Tired, decreased energy 1 1 1   Change in appetite 0 0 0  Feeling bad or failure about yourself  0 0 0  Trouble concentrating 0 0 0  Moving slowly or fidgety/restless 0 0 0  Suicidal thoughts 0 0 0  PHQ-9 Score 2  1  2    Difficult doing work/chores Not difficult at all Not difficult at all Not difficult at all     Data saved with a previous flowsheet row definition        12/21/2023    9:33 AM 10/21/2023    8:02 AM 06/15/2023   12:40 PM 09/02/2021    3:17 PM  GAD 7 : Generalized Anxiety Score  Nervous, Anxious, on Edge 1  1  1  1    Control/stop worrying 0  0  2  1   Worry too much - different things 0  0  2  1   Trouble relaxing 1  0  1  1   Restless 0  0  0  0   Easily annoyed or irritable 0  1  1  0   Afraid - awful might happen 0  0  2  1   Total GAD 7 Score 2 2 9 5   Anxiety Difficulty Not difficult at all Not difficult at all Somewhat difficult Somewhat difficult     Data saved with a previous flowsheet row definition    Assessment and Plan:  Pregnancy:  G2P1001 at [redacted]w[redacted]d 1. Encounter for supervision of low-risk pregnancy in third trimester (Primary) - patient doing well.  - Reports frequent and vigorous fetal movement.   2. Marginal insertion of umbilical cord affecting management of mother - Fundal height appropriate for gestational age.  - Last US  on 05/23/24  Est. FW:    2249  gm    4 lb 15 oz      71  %   Est. FW at 39 Wks:       3675  gm     8 lb 2 oz  3. [redacted] weeks gestation of pregnancy - Previously transverse on Ultrasound on 1/6 - Vertex by Leopolds today in clinic. Will confirm presentation at next visit.  - Culture, beta strep (group b only)  4. Respiratory syncytial virus (RSV) vaccine administered - Respiratory syncytial virus vaccine, preF, subunit, bivalent,(Abrysvo) - Cervicovaginal ancillary only( [place])  Preterm labor symptoms and general obstetric precautions  including but not limited to vaginal bleeding, contractions, leaking of fluid and fetal movement were reviewed in detail with the patient. Please refer to After Visit Summary for other counseling recommendations.   Return in about 1 week (around 06/28/2024) for With CNM.  Future Appointments  Date Time Provider Department Center  06/28/2024  8:35 AM Emilio Delilah HERO, CNM [facility]  07/04/2024  9:35 AM Anyanwu, Gloris LABOR, MD [facility]  10/13/2024 10:20 AM Corwin Antu, FNP [facility] [address]  02/27/2025  8:30 AM Jackquline Sawyer, MD [facility] None    Rodert Hinch (Claris) Emilio, MSN, CNM  [facility]  06/21/2024 11:58 AM     "

## 2024-06-22 LAB — CERVICOVAGINAL ANCILLARY ONLY
Chlamydia: NEGATIVE
Comment: NEGATIVE
Comment: NEGATIVE
Comment: NORMAL
Neisseria Gonorrhea: NEGATIVE
Trichomonas: NEGATIVE

## 2024-06-28 ENCOUNTER — Encounter: Admitting: Certified Nurse Midwife

## 2024-07-04 ENCOUNTER — Encounter: Admitting: Obstetrics & Gynecology

## 2024-10-13 ENCOUNTER — Encounter: Admitting: Family

## 2025-02-27 ENCOUNTER — Encounter: Admitting: Dermatology
